# Patient Record
Sex: Female | Born: 1965 | Race: Black or African American | Hispanic: No | Marital: Single | Smoking: Never smoker
Health system: Southern US, Community
[De-identification: ages and names within clinical notes are randomized; demographics above are authoritative.]

## PROBLEM LIST (undated history)

## (undated) DIAGNOSIS — G5600 Carpal tunnel syndrome, unspecified upper limb: Secondary | ICD-10-CM

## (undated) DIAGNOSIS — M1990 Unspecified osteoarthritis, unspecified site: Secondary | ICD-10-CM

---

## 1997-11-30 ENCOUNTER — Inpatient Hospital Stay (HOSPITAL_COMMUNITY): Admission: AD | Admit: 1997-11-30 | Discharge: 1997-11-30 | Payer: Self-pay | Admitting: Obstetrics

## 1998-10-30 ENCOUNTER — Encounter: Payer: Self-pay | Admitting: Emergency Medicine

## 1998-10-30 ENCOUNTER — Emergency Department (HOSPITAL_COMMUNITY): Admission: EM | Admit: 1998-10-30 | Discharge: 1998-10-30 | Payer: Self-pay | Admitting: Emergency Medicine

## 1999-03-26 ENCOUNTER — Inpatient Hospital Stay (HOSPITAL_COMMUNITY): Admission: AD | Admit: 1999-03-26 | Discharge: 1999-03-26 | Payer: Self-pay | Admitting: Obstetrics

## 1999-07-04 ENCOUNTER — Inpatient Hospital Stay (HOSPITAL_COMMUNITY): Admission: AD | Admit: 1999-07-04 | Discharge: 1999-07-04 | Payer: Self-pay | Admitting: Obstetrics

## 2001-04-07 ENCOUNTER — Inpatient Hospital Stay (HOSPITAL_COMMUNITY): Admission: AD | Admit: 2001-04-07 | Discharge: 2001-04-07 | Payer: Self-pay | Admitting: Family Medicine

## 2007-11-24 ENCOUNTER — Emergency Department (HOSPITAL_BASED_OUTPATIENT_CLINIC_OR_DEPARTMENT_OTHER): Admission: EM | Admit: 2007-11-24 | Discharge: 2007-11-24 | Payer: Self-pay | Admitting: Emergency Medicine

## 2010-12-19 LAB — URINALYSIS, ROUTINE W REFLEX MICROSCOPIC
Bilirubin Urine: NEGATIVE
Glucose, UA: NEGATIVE
Hgb urine dipstick: NEGATIVE
Ketones, ur: NEGATIVE
Nitrite: NEGATIVE
Protein, ur: NEGATIVE
Specific Gravity, Urine: 1.005
Urobilinogen, UA: 0.2
pH: 5.5

## 2010-12-19 LAB — BASIC METABOLIC PANEL
BUN: 7
CO2: 27
Calcium: 8.9
Chloride: 105
Creatinine, Ser: 0.8
GFR calc Af Amer: >60
GFR calc non Af Amer: >60
Glucose, Bld: 116 — ABNORMAL HIGH
Potassium: 3.7
Sodium: 140

## 2010-12-19 LAB — CBC
HCT: 37.5
Hemoglobin: 13
MCHC: 34.6
MCV: 93
Platelets: 215
RBC: 4.03
RDW: 12.4
WBC: 2.8 — ABNORMAL LOW

## 2010-12-19 LAB — URINE MICROSCOPIC-ADD ON

## 2010-12-19 LAB — PREGNANCY, URINE: Preg Test, Ur: NEGATIVE

## 2013-06-26 ENCOUNTER — Emergency Department (HOSPITAL_COMMUNITY)
Admission: EM | Admit: 2013-06-26 | Discharge: 2013-06-26 | Disposition: A | Discharge status: Home / Self Care | Payer: Self-pay | Attending: Emergency Medicine | Admitting: Emergency Medicine

## 2013-06-26 ENCOUNTER — Encounter (HOSPITAL_COMMUNITY): Payer: Self-pay | Admitting: Emergency Medicine

## 2013-06-26 ENCOUNTER — Emergency Department (HOSPITAL_COMMUNITY): Payer: Self-pay

## 2013-06-26 DIAGNOSIS — M898X9 Other specified disorders of bone, unspecified site: Secondary | ICD-10-CM | POA: Insufficient documentation

## 2013-06-26 DIAGNOSIS — M25471 Effusion, right ankle: Secondary | ICD-10-CM

## 2013-06-26 DIAGNOSIS — Z880 Allergy status to penicillin: Secondary | ICD-10-CM | POA: Insufficient documentation

## 2013-06-26 DIAGNOSIS — M19079 Primary osteoarthritis, unspecified ankle and foot: Secondary | ICD-10-CM | POA: Insufficient documentation

## 2013-06-26 DIAGNOSIS — M7730 Calcaneal spur, unspecified foot: Secondary | ICD-10-CM

## 2013-06-26 DIAGNOSIS — M25472 Effusion, left ankle: Secondary | ICD-10-CM

## 2013-06-26 MED ORDER — NAPROXEN 500 MG PO TABS
500.0000 mg | ORAL_TABLET | Freq: Once | ORAL | Status: AC
  Administered 2013-06-26: 500 mg via ORAL
  Filled 2013-06-26: qty 1

## 2013-06-26 MED ORDER — NAPROXEN 500 MG PO TABS: 500.0000 mg | ORAL_TABLET | Freq: Two times a day (BID) | ORAL | Status: DC

## 2013-06-26 NOTE — Discharge Instructions (Signed)
Call and make follow up appointment with Orthopedics as listed above. Take Naproxen as directed for pain and swelling. May supplement with OTC tylenol as directed on bottle. Rest, Ice, Elevate both ankles. Recommend good shoes with arch supports. Recommend compression socks.    Heel Spur A heel spur is a hook of bone that can form on the calcaneus (the heel bone and the largest bone of the foot). Heel spurs are often associated with plantar fasciitis and usually come in people who have had the problem for an extended period of time. The cause of the relationship is unknown. The pain associated with them is thought to be caused by an inflammation (soreness and redness) of the plantar fascia rather than the spur itself. The plantar fascia is a thick fibrous like tissue that runs from the calcaneus (heel bone) to the ball of the foot. This strong, tight tissue helps maintain the arch of your foot. It helps distribute the weight across your foot as you walk or run. Stresses placed on the plantar fascia can be tremendous. When it is inflamed normal activities become painful. Pain is worse in the morning after sleeping. After sleeping the plantar fascia is tight. The first movements stretch the fascia and this causes pain. As the tendon loosens, the pain usually gets better. It often returns with too much standing or walking.  About 70% of patients with plantar fasciitis have a heel spur. About half of people without foot pain also have heel spurs. DIAGNOSIS  The diagnosis of a heel spur is made by X-ray. The X-ray shows a hook of bone protruding from the bottom of the calcaneus at the point where the plantar fascia is attached to the heel bone.  TREATMENT  It is necessary to find out what is causing the stretching of the plantar fascia. If the cause is over-pronation (flat feet), orthotics and proper foot ware may help.  Stretching exercises, losing weight, wearing shoes that have a cushioned heel that  absorbs shock, and elevating the heel with the use of a heel cradle, heel cup, or orthotics may all help. Heel cradles and heel cups provide extra comfort and cushion to the heel, and reduce the amount of shock to the sore area. AVOIDING THE PAIN OF PLANTAR FASCIITIS AND HEEL SPURS  Consult a sports medicine professional before beginning a new exercise program.  Walking programs offer a good workout. There is a lower chance of overuse injuries common to the runners. There is less impact and less jarring of the joints.  Begin all new exercise programs slowly. If problems or pains develop, decrease the amount of time or distance until you are at a comfortable level.  Wear good shoes and replace them regularly.  Stretch your foot and the heel cords at the back of the ankle (Achilles tendons) both before and after exercise.  Run or exercise on even surfaces that are not hard. For example, asphalt is better than pavement.  Do not run barefoot on hard surfaces.  If using a treadmill, vary the incline.  Do not continue to workout if you have foot or joint problems. Seek professional help if they do not improve. HOME CARE INSTRUCTIONS   Avoid activities that cause you pain until you recover.  Use ice or cold packs to the problem or painful areas after working out.  Only take over-the-counter or prescription medicines for pain, discomfort, or fever as directed by your caregiver.  Soft shoe inserts or athletic shoes with air or  gel sole cushions may be helpful.  If problems continue or become more severe, consult a sports medicine caregiver. Cortisone is a potent anti-inflammatory medication that may be injected into the painful area. You can discuss this treatment with your caregiver. MAKE SURE YOU:   Understand these instructions.  Will watch your condition.  Will get help right away if you are not doing well or get worse. Document Released: 04/10/2005 Document Revised: 05/27/2011  Document Reviewed: 06/12/2005 [facility] Patient Information 2014 [place], [place].  Edema Edema is a buildup of fluids. It is most common in the feet, ankles, and legs. This happens more as a person ages. It may affect one or both legs. HOME CARE   Raise (elevate) the legs or ankles above the level of the heart while lying down.  Avoid sitting or standing still for a long time.  Exercise the legs to help the puffiness (swelling) go down.  A low-salt diet may help lessen the puffiness.  Only take medicine as told by your doctor. GET HELP RIGHT AWAY IF:   You develop shortness of breath or chest pain.  You cannot breathe when you lie down.  You have more puffiness that does not go away with treatment.  You develop pain or redness in the areas that are puffy.  You have a temperature by mouth above 102 F (38.9 C), not controlled by medicine.  You gain 03 lb/1.4 kg or more in 1 day or 05 lb/2.3 kg in a week. MAKE SURE YOU:   Understand these instructions.  Will watch your condition.  Will get help right away if you are not doing well or get worse. Document Released: 08/21/2007 Document Revised: 05/27/2011 Document Reviewed: 08/21/2007 [facility] Patient Information 2014 [place], [place].

## 2013-06-26 NOTE — ED Notes (Signed)
Pt reports bilateral foot pain x1 week. Sts she is flat footed and all shoes hurt but shes never had pain like this. Sts she has to prop her feet up at night to sleep. Obvious swelling to bilateral ankles. Denies injury, denies Hx of gout.

## 2013-06-26 NOTE — ED Provider Notes (Signed)
CSN: [id number]     Arrival date & time 06/26/13  1603 History   First MD Initiated Contact with Patient 06/26/13 1652     Chief Complaint  Patient presents with  . Foot Pain     (Consider location/radiation/quality/duration/timing/severity/associated sxs/prior Treatment) Patient is a 48 y.o. female presenting with lower extremity pain.  Foot Pain   48 yo female with no reported PMH, presents today with bilateral foot pain and swelling that started 1 week ago. Reports right is worse than left. Patient describes sharp pain intermittent pain rated at 9/10. Pain worse with standing and walking. Pain worse at night. 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 Patient reports having flat feet. Patient denies any calf pain. Denies Cp, SOB, weakness, or fever/chills.   History reviewed. No pertinent past medical history. History reviewed. No pertinent past surgical history. No family history on file. History  Substance Use Topics  . Smoking status: Never Smoker   . Smokeless tobacco: Not on file  . Alcohol Use: No   OB History   Grav Para Term Preterm Abortions TAB SAB Ect Mult Living                 Review of Systems  All other systems reviewed and are negative.     Allergies  Penicillins  Home Medications   Current Outpatient Rx  Name  Route  Sig  Dispense  Refill  . naproxen (NAPROSYN) 500 MG tablet   Oral   Take 1 tablet (500 mg total) by mouth 2 (two) times daily.   30 tablet   0    BP 158/80  Pulse 63  Temp(Src) 98 F (36.7 C) (Oral)  Resp 15  SpO2 99%  LMP 05/26/2013 Physical Exam  Nursing note and vitals reviewed. Constitutional: She is oriented to person, place, and time. She appears well-developed and well-nourished. No distress.  HENT:  Head: Normocephalic and atraumatic.  Eyes: Conjunctivae are normal.  Neck: No JVD present. No tracheal deviation present.  Cardiovascular: Normal rate and regular rhythm.  Exam reveals no gallop and no  friction rub.   No murmur heard. Pulmonary/Chest: Effort normal. No respiratory distress. She has no wheezes. She has no rhonchi. She has no rales.  Musculoskeletal: Normal range of motion. She exhibits no edema.  Bilateral ankle swelling, Right worse than left.  Tenderness to palpation of medial and lateral joint line of RIGHT ankle. Pain with inversion of Right ankle. Normal ROM bilateral ankles without pain.  No overlying eythema or warmth.   Neurological: She is alert and oriented to person, place, and time.  Skin: Skin is warm and dry. She is not diaphoretic.  Psychiatric: She has a normal mood and affect. Her behavior is normal.    ED Course  Procedures (including critical care time) Labs Review Labs Reviewed - No data to display Imaging Review Dg Ankle Complete Left  06/26/2013   CLINICAL DATA:  Bilateral ankle swelling for 1 week, no history of trauma  EXAM: LEFT ANKLE COMPLETE - 3+ VIEW  COMPARISON:  None.  FINDINGS: Diffuse soft tissue swelling. No fracture or dislocation. Moderate heel spur. Mild tibiotalar arthritis.  IMPRESSION: Nonspecific soft tissue swelling   Electronically Signed   By: Esperanza Heir M.D.   On: 06/26/2013 17:55   Dg Ankle Complete Right  06/26/2013   CLINICAL DATA:  Bilateral ankle swelling for 1 week with no trauma  EXAM: RIGHT ANKLE - COMPLETE 3+ VIEW  COMPARISON:  None.  FINDINGS: Diffuse soft tissue swelling. No fracture or dislocation. Moderate heel spur. Mild tibiotalar arthritis.  IMPRESSION: Nonspecific soft tissue swelling   Electronically Signed   By: Esperanza Heiraymond  Rubner M.D.   On: 06/26/2013 17:55     EKG Interpretation None      MDM   Final diagnoses:  Swelling of both ankles  Heel spur  Arthritis of ankle joint   Patietn afebrile. Presents with bilateral ankle pain/swelling. Doubt gout, septic joint, or hemarthrosis.  Plain films show bilateral heel spurs with tibiotalar arthritis.  Plan to have patient follow up with Ortho in 1 week  for further evaluation and management.  Will treat patient symptoms. Recommend supplement with OTC tylenol as directed on bottle. Rest, Ice, Elevate both ankles. Recommend good shoes with arch supports. Recommend compression socks. Recommend establishing PCP for recheck of BP as it was slightly elevated today. Patient confirms understanding. Discharged in good condition.   Meds given in ED:  Medications  naproxen (NAPROSYN) tablet 500 mg (500 mg Oral Given 06/26/13 1756)    New Prescriptions   NAPROXEN (NAPROSYN) 500 MG TABLET    Take 1 tablet (500 mg total) by mouth 2 (two) times daily.       Rudene AndaJacob Gray Anmol Fleck, PA-C 06/27/13 1331

## 2013-06-27 NOTE — ED Provider Notes (Signed)
Medical screening examination/treatment/procedure(s) were conducted as a shared visit with non-physician practitioner(s) or resident  and myself.  I personally evaluated the patient during the encounter and agree with the findings and plan unless otherwise indicated.    I have personally reviewed any xrays and/ or EKG's with the provider and I agree with interpretation.   New ankle swelling bilateral without injury, fevers, rash or other joint involvement, no hx of gout or RA.  Xray done.  Pt has known flat feet.  Discussed supportive care and ortho fup. No concern for septic joint. Dg Ankle Complete Left  06/26/2013   CLINICAL DATA:  Bilateral ankle swelling for 1 week, no history of trauma  EXAM: LEFT ANKLE COMPLETE - 3+ VIEW  COMPARISON:  None.  FINDINGS: Diffuse soft tissue swelling. No fracture or dislocation. Moderate heel spur. Mild tibiotalar arthritis.  IMPRESSION: Nonspecific soft tissue swelling   Electronically Signed   By: Esperanza Heiraymond  Rubner M.D.   On: 06/26/2013 17:55   Dg Ankle Complete Right  06/26/2013   CLINICAL DATA:  Bilateral ankle swelling for 1 week with no trauma  EXAM: RIGHT ANKLE - COMPLETE 3+ VIEW  COMPARISON:  None.  FINDINGS: Diffuse soft tissue swelling. No fracture or dislocation. Moderate heel spur. Mild tibiotalar arthritis.  IMPRESSION: Nonspecific soft tissue swelling   Electronically Signed   By: Esperanza Heiraymond  Rubner M.D.   On: 06/26/2013 17:55   Ankle swelling bilateral, ankle arthritis  Enid SkeensJoshua M Micheala Morissette, MD 06/27/13 1727

## 2014-12-28 ENCOUNTER — Emergency Department (HOSPITAL_COMMUNITY)
Admission: EM | Admit: 2014-12-28 | Discharge: 2014-12-28 | Disposition: A | Discharge status: Home / Self Care | Payer: Self-pay | Attending: Emergency Medicine | Admitting: Emergency Medicine

## 2014-12-28 ENCOUNTER — Encounter (HOSPITAL_COMMUNITY): Payer: Self-pay | Admitting: Emergency Medicine

## 2014-12-28 ENCOUNTER — Emergency Department (HOSPITAL_COMMUNITY): Payer: Self-pay

## 2014-12-28 DIAGNOSIS — R51 Headache: Secondary | ICD-10-CM | POA: Insufficient documentation

## 2014-12-28 DIAGNOSIS — M1990 Unspecified osteoarthritis, unspecified site: Secondary | ICD-10-CM | POA: Insufficient documentation

## 2014-12-28 DIAGNOSIS — Z880 Allergy status to penicillin: Secondary | ICD-10-CM | POA: Insufficient documentation

## 2014-12-28 DIAGNOSIS — M25562 Pain in left knee: Secondary | ICD-10-CM

## 2014-12-28 DIAGNOSIS — M79671 Pain in right foot: Secondary | ICD-10-CM

## 2014-12-28 DIAGNOSIS — M159 Polyosteoarthritis, unspecified: Secondary | ICD-10-CM

## 2014-12-28 MED ORDER — IBUPROFEN 800 MG PO TABS: 800.0000 mg | ORAL_TABLET | Freq: Three times a day (TID) | ORAL | Status: DC

## 2014-12-28 MED ORDER — TRAMADOL HCL 50 MG PO TABS: 50.0000 mg | ORAL_TABLET | Freq: Four times a day (QID) | ORAL | Status: DC | PRN

## 2014-12-28 MED ORDER — TRAMADOL HCL 50 MG PO TABS
50.0000 mg | ORAL_TABLET | Freq: Once | ORAL | Status: AC
  Administered 2014-12-28: 50 mg via ORAL
  Filled 2014-12-28: qty 1

## 2014-12-28 NOTE — ED Provider Notes (Signed)
CSN: [id number]     Arrival date & time 12/28/14  1510 History  By signing my name below, I, [name], attest that this documentation has been prepared under the direction and in the presence of Danelle Berry, PA-C. Electronically Signed: [name], ED Scribe. 12/28/2014. 5:15 PM.   Chief Complaint  Patient presents with  . Generalized Body Aches   The history is provided by the patient. No language interpreter was used.    HPI Comments: Karen Cuevas is a 49 y.o. female who presents to the Emergency Department complaining of multiple points of moderate pain and swelling with onset in the past few days. 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 Pt notes associated pain and swelling to her bilateral feet, bilateral ankles and left knee. She also notes an associated, mild, intermittent, HA . Pt notes taking OTC aleve and ibuprofen which has provided no relief. Pt notes a hx of seasonal allergies but denies any other known medical conditions. She denies any hx of injury to the affected regions but confirms being told by a prior provider that she has arthritis. She denies any n/v/d, abd pain and redness or irritation.   History reviewed. No pertinent past medical history. No past surgical history on file. No family history on file. Social History  Substance Use Topics  . Smoking status: Never Smoker   . Smokeless tobacco: None  . Alcohol Use: No   OB History    No data available     Review of Systems  Gastrointestinal: Negative for nausea, vomiting, abdominal pain and diarrhea.  Musculoskeletal: Positive for myalgias, joint swelling and arthralgias.  Skin: Negative for color change and rash.  Neurological: Positive for headaches.   Allergies  Penicillins  Home Medications   Prior to Admission medications   Medication Sig Start Date End Date Taking? Authorizing Provider  Homeopathic Products (BHI BACK PAIN RELIEF) TABS Take 1-2  tablets by mouth every 6 (six) hours as needed (pain).   Yes Historical Provider, MD  naproxen sodium (ANAPROX) 220 MG tablet Take 220-660 mg by mouth every 6 (six) hours as needed (pain).   Yes Historical Provider, MD  ibuprofen (ADVIL,MOTRIN) 800 MG tablet Take 1 tablet (800 mg total) by mouth 3 (three) times daily. 12/28/14   Danelle Berry, PA-C  traMADol (ULTRAM) 50 MG tablet Take 1 tablet (50 mg total) by mouth every 6 (six) hours as needed. 12/28/14   Danelle Berry, PA-C   BP 118/68 mmHg  Pulse 65  Temp(Src) 98 F (36.7 C) (Oral)  Resp 18  SpO2 97%  LMP 11/23/2014 Physical Exam  Constitutional: She is oriented to person, place, and time. She appears well-developed and well-nourished. No distress.  HENT:  Head: Normocephalic and atraumatic.  Right Ear: External ear normal.  Left Ear: External ear normal.  Nose: Nose normal.  Mouth/Throat: Oropharynx is clear and moist. No oropharyngeal exudate.  Eyes: Conjunctivae and EOM are normal. Pupils are equal, round, and reactive to light. Right eye exhibits no discharge. Left eye exhibits no discharge. No scleral icterus.  Neck: Normal range of motion. Neck supple. No JVD present. No tracheal deviation present.  Cardiovascular: Normal rate and regular rhythm.   Pulmonary/Chest: Effort normal and breath sounds normal. No stridor. No respiratory distress.  Musculoskeletal: Normal range of motion. She exhibits edema and tenderness.       Left knee: She exhibits normal range of motion, no swelling, no deformity, no erythema, normal alignment and  no bony tenderness. No medial joint line, no lateral joint line, no MCL, no LCL and no patellar tendon tenderness noted.       Right ankle: She exhibits swelling and deformity. She exhibits normal range of motion, no ecchymosis, no laceration and normal pulse. Tenderness. Lateral malleolus, medial malleolus, AITFL, CF ligament and posterior TFL tenderness found. No head of 5th metatarsal and no proximal fibula  tenderness found. Achilles tendon exhibits no pain, no defect and normal Thompson's test results.       Legs:      Feet:  Lymphadenopathy:    She has no cervical adenopathy.  Neurological: She is alert and oriented to person, place, and time. She exhibits normal muscle tone. Coordination normal.  Skin: Skin is warm and dry. No rash noted. She is not diaphoretic. No erythema. No pallor.  Psychiatric: She has a normal mood and affect. Her behavior is normal. Judgment and thought content normal.   ED Course  Procedures  DIAGNOSTIC STUDIES: Oxygen Saturation is 99% on RA, normal by my interpretation.    COORDINATION OF CARE: 5:08 PM Discussed treatment plan with pt at bedside including 1x 50 mg tramadol and imaging of the right ankle and foot. Pt agreed to plan.  Labs Review Labs Reviewed - No data to display  Imaging Review DG Foot Complete Right (Final result) Result time: 12/28/14 17:35:09   Final result by Rad Results In Interface (12/28/14 17:35:09)   Narrative:   CLINICAL DATA: Chronic right foot pain, worsening. History of arthritis.  EXAM: RIGHT FOOT COMPLETE - 3+ VIEW  COMPARISON: None.  FINDINGS: Degenerative changes in the midfoot and hindfoot. Mild osteoarthritis in the first MTP joint. No acute bony abnormality. Specifically, no fracture, subluxation, or dislocation. Soft tissues are intact. Plantar calcaneal spur.  IMPRESSION: No acute bony abnormality.   Electronically Signed By: Charlett Nose M.D. On: 12/28/2014 17:35          DG Ankle Complete Right (Final result) Result time: 12/28/14 17:42:03   Final result by Rad Results In Interface (12/28/14 17:42:03)   Narrative:   CLINICAL DATA: Worsening right ankle pain and swelling. Arthritis. No known injury.  EXAM: RIGHT ANKLE - COMPLETE 3+ VIEW  COMPARISON: None.  FINDINGS: There is no evidence of fracture, dislocation, or joint effusion. Mild to moderate degenerative spurring is  seen involving the ankle joint. Diffuse soft tissue swelling also noted.  IMPRESSION: No evidence of fracture or dislocation.  Ankle DJD and soft tissue swelling.   Electronically Signed By: Myles Rosenthal M.D. On: 12/28/2014 17:42     No results found. I have personally reviewed and evaluated these images as part of my medical decision-making.   EKG Interpretation None      MDM   Final diagnoses:  Right foot pain  Osteoarthritis of multiple joints, unspecified osteoarthritis type  Left knee pain    Pt with multiple joint pain and muscle aches after strenous physical labor at her job Xrays show progressive DJD, no acute osseous injury Pt given tramadol in ED, prescription ibuprofen and tramadol rx given.  Pt wants to be in "no pain," however I have explained to her that she has chronic joint disease and OA, and will likely have chronic joint pain, worse with overuse.  Case mgmt to help with establishing care (possible Orange card help?) for pt's future need for ortho referral access.  D/C home with tramadol and NSAID  I personally performed the services described in this documentation, which was scribed in my  presence. The recorded information has been reviewed and is accurate.    Danelle BerryLeisa Paton Crum, PA-C 01/06/15 [id number]  Arby BarretteMarcy Pfeiffer, MD 01/16/15 [phone]

## 2014-12-28 NOTE — Progress Notes (Signed)
EDCM spoke to patient at bedside. Patient confirms she [place] a [place]cp [place]  [facility], informed patient of services there.  EDCM also provided patient with list of pcps who accept self pay patients, list of discount pharmacies and websites needymeds.org and GoodRX.com for medication assistance, phone number to inquire about the orange card, phone number to inquire about Mediciad, phone number to inquire about the Affordable Care Act, financial resources in the community such as local churches, [employer], urban ministries, and dental assistance for uninsured patients.  Patient thankful for resources.  No further EDCM needs at this time.  [facility].  [facility] referral completed.

## 2014-12-28 NOTE — ED Notes (Signed)
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  C/o rt foot pain, lt knee pain, and gen body aches.  Denies NVD.  Denies abd pain.

## 2014-12-28 NOTE — Discharge Instructions (Signed)
Joint Pain Joint pain can be caused by many things. The joint can be bruised, infected, weak from aging, or sore from exercise. The pain will probably go away if you follow your doctor's instructions for home care. If your joint pain continues, more tests may be needed to help find the cause of your condition. HOME CARE Watch your condition for any changes. Follow these instructions as told to lessen the pain that you are feeling:  Take medicines only as told by your doctor.  Rest the sore joint for as long as told by your doctor. If your doctor tells you to, raise (elevate) the painful joint above the level of your heart while you are sitting or lying down.  Do not do things that cause pain or make the pain worse.  If told, put ice on the painful area:  Put ice in a plastic bag.  Place a towel between your skin and the bag.  Leave the ice on for 20 minutes, 2-3 times per day.  Wear an elastic bandage, splint, or sling as told by your doctor. Loosen the bandage or splint if your fingers or toes lose feeling (become numb) and tingle, or if they turn cold and blue.  Begin exercising or stretching the joint as told by your doctor. Ask your doctor what types of exercise are safe for you.  Keep all follow-up visits as told by your doctor. This is important. GET HELP IF:  Your pain gets worse and medicine does not help it.  Your joint pain does not get better in 3 days.  You have more bruising or swelling.  You have a fever.  You lose 10 pounds (4.5 kg) or more without trying. GET HELP RIGHT AWAY IF:  You are not able to move the joint.  Your fingers or toes become numb or they turn cold and blue.   This information is not intended to replace advice given to you by your health care provider. Make sure you discuss any questions you have with your health care provider.   Document Released: 02/20/2009 Document Revised: 03/25/2014 Document Reviewed: 12/14/2013 Elsevier Interactive  Patient Education 2016 Elsevier Inc.  Osteoarthritis Osteoarthritis is a disease that causes soreness and inflammation of a joint. It occurs when the cartilage at the affected joint wears down. Cartilage acts as a cushion, covering the ends of bones where they meet to form a joint. Osteoarthritis is the most common form of arthritis. It often occurs in older people. The joints affected most often by this condition include those in the:  Ends of the fingers.  Thumbs.  Neck.  Lower back.  Knees.  Hips. CAUSES  Over time, the cartilage that covers the ends of bones begins to wear away. This causes bone to rub on bone, producing pain and stiffness in the affected joints.  RISK FACTORS Certain factors can increase your chances of having osteoarthritis, including:  Older age.  Excessive body weight.  Overuse of joints.  Previous joint injury. SIGNS AND SYMPTOMS   Pain, swelling, and stiffness in the joint.  Over time, the joint may lose its normal shape.  Small deposits of bone (osteophytes) may grow on the edges of the joint.  Bits of bone or cartilage can break off and float inside the joint space. This may cause more pain and damage. DIAGNOSIS  Your health care provider will do a physical exam and ask about your symptoms. Various tests may be ordered, such as:  X-rays of the affected  joint.  Blood tests to rule out other types of arthritis. Additional tests may be used to diagnose your condition. TREATMENT  Goals of treatment are to control pain and improve joint function. Treatment plans may include:  A prescribed exercise program that allows for rest and joint relief.  A weight control plan.  Pain relief techniques, such as:  Properly applied heat and cold.  Electric pulses delivered to nerve endings under the skin (transcutaneous electrical nerve stimulation [TENS]).  Massage.  Certain nutritional supplements.  Medicines to control pain, such  as:  Acetaminophen.  Nonsteroidal anti-inflammatory drugs (NSAIDs), such as naproxen.  Narcotic or central-acting agents, such as tramadol.  Corticosteroids. These can be given orally or as an injection.  Surgery to reposition the bones and relieve pain (osteotomy) or to remove loose pieces of bone and cartilage. Joint replacement may be needed in advanced states of osteoarthritis. HOME CARE INSTRUCTIONS   Take medicines only as directed by your health care provider.  Maintain a healthy weight. Follow your health care provider's instructions for weight control. This may include dietary instructions.  Exercise as directed. Your health care provider can recommend specific types of exercise. These may include:  Strengthening exercises. These are done to strengthen the muscles that support joints affected by arthritis. They can be performed with weights or with exercise bands to add resistance.  Aerobic activities. These are exercises, such as brisk walking or low-impact aerobics, that get your heart pumping.  Range-of-motion activities. These keep your joints limber.  Balance and agility exercises. These help you maintain daily living skills.  Rest your affected joints as directed by your health care provider.  Keep all follow-up visits as directed by your health care provider. SEEK MEDICAL CARE IF:   Your skin turns red.  You develop a rash in addition to your joint pain.  You have worsening joint pain.  You have a fever along with joint or muscle aches. SEEK IMMEDIATE MEDICAL CARE IF:  You have a significant loss of weight or appetite.  You have night sweats. FOR MORE INFORMATION   National Institute of Arthritis and Musculoskeletal and Skin Diseases: www.niams.http://www.myers.net/nih.gov  [employer] Aging: https://walker.com/www.nia.nih.gov  American College of Rheumatology: www.rheumatology.org   This information is not intended to replace advice given to you by your health care provider.  Make sure you discuss any questions you have with your health care provider.   Document Released: 03/04/2005 Document Revised: 03/25/2014 Document Reviewed: 11/09/2012 Elsevier Interactive Patient Education [employer] Elsevier Inc.

## 2015-12-19 ENCOUNTER — Telehealth (HOSPITAL_COMMUNITY): Payer: Self-pay

## 2016-01-10 ENCOUNTER — Ambulatory Visit (HOSPITAL_COMMUNITY): Payer: Self-pay | Admitting: Clinical

## 2016-05-29 ENCOUNTER — Emergency Department (HOSPITAL_BASED_OUTPATIENT_CLINIC_OR_DEPARTMENT_OTHER): Payer: No Typology Code available for payment source

## 2016-05-29 ENCOUNTER — Emergency Department (HOSPITAL_BASED_OUTPATIENT_CLINIC_OR_DEPARTMENT_OTHER)
Admission: EM | Admit: 2016-05-29 | Discharge: 2016-05-29 | Disposition: A | Discharge status: Home / Self Care | Payer: No Typology Code available for payment source | Attending: Emergency Medicine | Admitting: Emergency Medicine

## 2016-05-29 ENCOUNTER — Encounter (HOSPITAL_BASED_OUTPATIENT_CLINIC_OR_DEPARTMENT_OTHER): Payer: Self-pay

## 2016-05-29 DIAGNOSIS — S99922A Unspecified injury of left foot, initial encounter: Secondary | ICD-10-CM | POA: Diagnosis present

## 2016-05-29 DIAGNOSIS — Y999 Unspecified external cause status: Secondary | ICD-10-CM | POA: Insufficient documentation

## 2016-05-29 DIAGNOSIS — S8392XA Sprain of unspecified site of left knee, initial encounter: Secondary | ICD-10-CM

## 2016-05-29 DIAGNOSIS — Y939 Activity, unspecified: Secondary | ICD-10-CM | POA: Insufficient documentation

## 2016-05-29 DIAGNOSIS — S93602A Unspecified sprain of left foot, initial encounter: Secondary | ICD-10-CM | POA: Diagnosis not present

## 2016-05-29 DIAGNOSIS — Y92410 Unspecified street and highway as the place of occurrence of the external cause: Secondary | ICD-10-CM | POA: Diagnosis not present

## 2016-05-29 HISTORY — DX: Unspecified osteoarthritis, unspecified site: M19.90
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
## 2019-05-26 ENCOUNTER — Encounter: Payer: Self-pay | Admitting: Neurology

## 2019-06-02 ENCOUNTER — Telehealth: Payer: Self-pay

## 2019-06-04 NOTE — Telephone Encounter (Signed)
ERROR

## 2019-06-25 ENCOUNTER — Other Ambulatory Visit: Payer: Self-pay

## 2019-06-25 ENCOUNTER — Encounter (HOSPITAL_BASED_OUTPATIENT_CLINIC_OR_DEPARTMENT_OTHER): Payer: Self-pay

## 2019-06-25 ENCOUNTER — Emergency Department (HOSPITAL_BASED_OUTPATIENT_CLINIC_OR_DEPARTMENT_OTHER): Payer: 59

## 2019-06-25 ENCOUNTER — Emergency Department (HOSPITAL_BASED_OUTPATIENT_CLINIC_OR_DEPARTMENT_OTHER)
Admission: EM | Admit: 2019-06-25 | Discharge: 2019-06-25 | Disposition: A | Discharge status: Home / Self Care | Payer: 59 | Attending: Emergency Medicine | Admitting: Emergency Medicine

## 2019-06-25 DIAGNOSIS — M1990 Unspecified osteoarthritis, unspecified site: Secondary | ICD-10-CM | POA: Insufficient documentation

## 2019-06-25 DIAGNOSIS — M25561 Pain in right knee: Secondary | ICD-10-CM | POA: Diagnosis present

## 2019-06-25 HISTORY — DX: Carpal tunnel syndrome, unspecified upper limb: G56.00

## 2019-06-25 MED ORDER — DICLOFENAC SODIUM 1 % EX GEL: 2.0000 g | Freq: Four times a day (QID) | CUTANEOUS | 0 refills | Status: AC | PRN

## 2019-06-25 MED ORDER — IBUPROFEN 800 MG PO TABS: 800.0000 mg | ORAL_TABLET | Freq: Three times a day (TID) | ORAL | 0 refills | Status: AC | PRN

## 2019-06-25 NOTE — Discharge Instructions (Signed)
Your workup was reassuring today that you do not have a bony injury or dislocation to your knee.  We recommend that you use the provided Ace wrap and crutches as needed, but you can continue to bear weight as tolerated.  Read through the included information about routine care for injuries.  Follow up as recommended if you are still having problems in about a week. ° °Knee Pain °Knee pain is a very common symptom and can have many causes. Knee pain often goes away when you follow your health care provider's instructions for relieving pain and discomfort at home. However, knee pain can develop into a condition that needs treatment. Some conditions may include: °Arthritis caused by wear and tear (osteoarthritis). °Arthritis caused by swelling and irritation (rheumatoid arthritis or gout). °A cyst or growth in your knee. °An infection in your knee joint. °An injury that will not heal. °Damage, swelling, or irritation of the tissues that support your knee (torn ligaments or tendinitis). °If your knee pain continues, additional tests may be ordered to diagnose your condition. Tests may include X-rays or other imaging studies of your knee. You may also need to have fluid removed from your knee. Treatment for ongoing knee pain depends on the cause, but treatment may include: °Medicines to relieve pain or swelling. °Steroid injections in your knee. °Physical therapy. °Surgery. °HOME CARE INSTRUCTIONS °Take medicines only as directed by your health care provider. °Rest your knee and keep it raised (elevated) while you are resting. °Do not do things that cause or worsen pain. °Avoid high-impact activities or exercises, such as running, jumping rope, or doing jumping jacks. °Apply ice to the knee area: °Put ice in a plastic bag. °Place a towel between your skin and the bag. °Leave the ice on for 20 minutes, 2-3 times a day. °Ask your health care provider if you should wear an elastic knee support. °Keep a pillow under your  knee when you sleep. °Lose weight if you are overweight. Extra weight can put pressure on your knee. °Do not use any tobacco products, including cigarettes, chewing tobacco, or electronic cigarettes. If you need help quitting, ask your health care provider. Smoking may slow the healing of any bone and joint problems that you may have. °SEEK MEDICAL CARE IF: °Your knee pain continues, changes, or gets worse. °You have a fever along with knee pain. °Your knee buckles or locks up. °Your knee becomes more swollen. °SEEK IMMEDIATE MEDICAL CARE IF:  °Your knee joint feels hot to the touch. °You have chest pain or trouble breathing. °  °This information is not intended to replace advice given to you by your health care provider. Make sure you discuss any questions you have with your health care provider. °  °Document Released: 12/30/2006 Document Revised: 03/25/2014 Document Reviewed: 10/18/2013 °Elsevier Interactive Patient Education ©2016 Elsevier Inc. ° ° °Elastic Bandage and RICE  ° °WHAT DOES AN ELASTIC BANDAGE DO?  ° °Elastic bandages come in different shapes and sizes. They generally provide support to your injury and reduce swelling while you are healing, but they can perform different functions. Your health care provider will help you to decide what is best for your protection, recovery, or rehabilitation following an injury.  °WHAT ARE SOME GENERAL TIPS FOR USING AN ELASTIC BANDAGE?  °Use the bandage as directed by the maker of the bandage that you are using.  °Do not wrap the bandage too tightly. This may cut off the circulation in the arm or leg in the   area below the bandage.  °If part of your body beyond the bandage becomes blue, numb, cold, swollen, or is more painful, your bandage is most likely too tight. If this occurs, remove your bandage and reapply it more loosely. °See your health care provider if the bandage seems to be making your problems worse rather than better.  °An elastic bandage should be  removed and reapplied every 3-4 hours or as directed by your health care provider. °WHAT IS RICE?  °The routine care of many injuries includes rest, ice, compression, and elevation (RICE therapy).  °Rest  °Rest is required to allow your body to heal. Generally, you can resume your routine activities when you are comfortable and have been given permission by your health care provider.  °Ice  °Icing your injury helps to keep the swelling down and it reduces pain. Do not apply ice directly to your skin.  °Put ice in a plastic bag.  °Place a towel between your skin and the bag.  °Leave the ice on for 20 minutes, 2-3 times per day. °Do this for as Demetres Prochnow as you are directed by your health care provider.  °Compression  °Compression helps to keep swelling down, gives support, and helps with discomfort. Compression may be done with an elastic bandage.  °Elevation  °Elevation helps to reduce swelling and it decreases pain. If possible, your injured area should be placed at or above the level of your heart or the center of your chest.  °WHEN SHOULD I SEEK MEDICAL CARE?  °You should seek medical care if:  °You have persistent pain and swelling.  °Your symptoms are getting worse rather than improving. °These symptoms may indicate that further evaluation or further X-rays are needed. Sometimes, X-rays may not show a small broken bone (fracture) until a number of days later. Make a follow-up appointment with your health care provider. Ask when your X-ray results will be ready. Make sure that you get your X-ray results.  °WHEN SHOULD I SEEK IMMEDIATE MEDICAL CARE?  °You should seek immediate medical care if:  °You have a sudden onset of severe pain at or below the area of your injury.  °You develop redness or increased swelling around your injury.  °You have tingling or numbness at or below the area of your injury that does not improve after you remove the elastic bandage. °This information is not intended to replace advice given to  you by your health care provider. Make sure you discuss any questions you have with your health care provider.  °Document Released: 08/24/2001 Document Revised: 11/23/2014 Document Reviewed: 10/18/2013  °Elsevier Interactive Patient Education ©2016 Elsevier Inc.  ° °

## 2019-06-25 NOTE — ED Triage Notes (Signed)
Pt c/o pain/swelling to right knee x 3 days-denies injury-limping gait

## 2019-06-25 NOTE — ED Provider Notes (Signed)
Emergency Department Provider Note   I have reviewed the triage vital signs and the nursing notes.   HISTORY  Chief Complaint Knee Pain   HPI Karen Cuevas is a 54 y.o. female with PMH of arthritis and carpal tunnel presents to the emergency department for evaluation of right knee pain worsening over the past 3 to 4 days.  Patient denies any injury.  She stands at work for her job and has noticed increasing pain and some swelling in the right knee compared to the left.  She denies pain in the back of the knee or calf.  No fevers, knee redness, warmth.  She has tried Tylenol with no relief.  Symptoms seem to be gradually worsening.  No prior history of similar pain in the knee.  Pain is moderate, worse with standing, no radiation of symptoms.   Past Medical History:  Diagnosis Date  . Arthritis   . Carpal tunnel syndrome     There are no problems to display for this patient.   History reviewed. No pertinent surgical history.  Allergies Penicillins  No family history on file.  Social History Social History   Tobacco Use  . Smoking status: Never Smoker  . Smokeless tobacco: Never Used  Substance Use Topics  . Alcohol use: No  . Drug use: No    Review of Systems  Constitutional: No fever/chills Gastrointestinal: No abdominal pain. Musculoskeletal: Negative for back pain. Positive right knee pain.  Skin: Negative for rash.  ____________________________________________   PHYSICAL EXAM:  VITAL SIGNS: ED Triage Vitals  Enc Vitals Group     BP 06/25/19 1710 (!) 156/89     Pulse Rate 06/25/19 1710 80     Resp 06/25/19 1710 18     Temp 06/25/19 1710 98.2 F (36.8 C)     Temp Source 06/25/19 1710 Oral     SpO2 06/25/19 1710 100 %     Weight 06/25/19 1710 296 lb (134.3 kg)     Height 06/25/19 1710 5\' 2"  (1.575 m)   Constitutional: Alert and oriented. Well appearing and in no acute distress. Eyes: Conjunctivae are normal.  Head: Atraumatic. Neck: No  stridor.  Cardiovascular: Good peripheral circulation. Respiratory: Normal respiratory effort.  Gastrointestinal: No distention.  Musculoskeletal: No gross deformities of extremities.  Mild swelling of the right knee without erythema or warmth.  Normal range of motion of the right knee. No calf tenderness or swelling noted.  Neurologic:  Normal speech and language.  Skin:  Skin is warm, dry and intact. No rash noted. No erythema over the right knee. No warmth.   ____________________________________________  RADIOLOGY  DG Knee Complete 4 Views Right  Result Date: 06/25/2019 CLINICAL DATA:  Right knee pain/swelling EXAM: RIGHT KNEE - COMPLETE 4+ VIEW COMPARISON:  None. FINDINGS: No fracture or dislocation is seen. Moderate tricompartmental degenerative changes, most prominent at the patellofemoral compartment. Small suprapatellar knee joint effusion. IMPRESSION: Moderate degenerative changes with small suprapatellar knee joint effusion. Electronically Signed   By: Julian Hy M.D.   On: 06/25/2019 17:29    ____________________________________________   PROCEDURES  Procedure(s) performed:   Procedures  None  ____________________________________________   INITIAL IMPRESSION / ASSESSMENT AND PLAN / ED COURSE  Pertinent labs & imaging results that were available during my care of the patient were reviewed by me and considered in my medical decision making (see chart for details).   Patient presents to the emergency department for evaluation of right knee pain.  No clinical evidence  to suspect septic joint.  X-ray reviewed showing small knee effusion with moderate degenerative changes.  Plan for Ace wrap and RICE treatment at home with sports med f/u. Provided Motrin and Voltaren Rx. Discussed ED return precautions. Exam not consistent with DVT.    ____________________________________________  FINAL CLINICAL IMPRESSION(S) / ED DIAGNOSES  Final diagnoses:  Acute pain of right  knee    NEW OUTPATIENT MEDICATIONS STARTED DURING THIS VISIT:  New Prescriptions   DICLOFENAC SODIUM (VOLTAREN) 1 % GEL    Apply 2 g topically 4 (four) times daily as needed.   IBUPROFEN (ADVIL) 800 MG TABLET    Take 1 tablet (800 mg total) by mouth every 8 (eight) hours as needed.    Note:  This document was prepared using Dragon voice recognition software and may include unintentional dictation errors.  Alona Bene, MD, [facility] Emergency Medicine    Frederick Marro, Arlyss Repress, MD 06/25/19 [phone]

## 2019-07-05 ENCOUNTER — Other Ambulatory Visit: Payer: Self-pay

## 2019-07-05 ENCOUNTER — Encounter: Payer: Self-pay | Admitting: Orthopedic Surgery

## 2019-07-05 ENCOUNTER — Ambulatory Visit (INDEPENDENT_AMBULATORY_CARE_PROVIDER_SITE_OTHER): Payer: 59 | Admitting: Orthopedic Surgery

## 2019-07-05 VITALS — Ht 62.0 in | Wt 296.0 lb

## 2019-07-05 DIAGNOSIS — M1711 Unilateral primary osteoarthritis, right knee: Secondary | ICD-10-CM

## 2019-07-05 MED ORDER — METHYLPREDNISOLONE ACETATE 40 MG/ML IJ SUSP
40.0000 mg | INTRAMUSCULAR | Status: AC | PRN
  Administered 2019-07-05: 40 mg via INTRA_ARTICULAR

## 2019-07-05 MED ORDER — LIDOCAINE HCL (PF) 1 % IJ SOLN
5.0000 mL | INTRAMUSCULAR | Status: AC | PRN
  Administered 2019-07-05: 5 mL

## 2019-07-05 NOTE — Progress Notes (Signed)
Office Visit Note   Patient: Karen Cuevas           Date of Birth: 02/07/1966           MRN: [id number] Visit Date: 07/05/2019              Requested by: No referring provider defined for this encounter. PCP: Patient, No Pcp Per  Chief Complaint  Patient presents with  . Right Knee - Pain      HPI: Patient is a 54 year old woman who presents with arthritic pain of her right knee for a week she denies any injuries she complains of stiffness states the knee feels tight has swelling and has had giving way.  She complains of pain with ambulation with popping and clicking.  She states she does have start up stiffness.  She [facility] [place] [facility] radiographs showed no bony abnormalities.  She has been using ibuprofen for pain.  Assessment & Plan: Visit Diagnoses:  1. Unilateral primary osteoarthritis, right knee     Plan: The knee was injected she tolerated this well recommended strength and exercise.  Will reevaluate in 4 weeks.  Follow-Up Instructions: Return in about 4 weeks (around 08/02/2019).   Ortho Exam  Patient is alert, oriented, no adenopathy, well-dressed, normal affect, normal respiratory effort. Examination patient has an antalgic gait she has a mild effusion she has crepitation in the patellofemoral joint with range of motion medial and lateral joint lines are minimally tender to palpation collaterals and cruciates are stable.  Imaging: No results found. No images are attached to the encounter.  Labs: No results found for: HGBA1C, ESRSEDRATE, CRP, LABURIC, REPTSTATUS, GRAMSTAIN, CULT, LABORGA   No results found for: ALBUMIN, PREALBUMIN, LABURIC  No results found for: MG No results found for: VD25OH  No results found for: PREALBUMIN CBC EXTENDED 11/24/2007  WBC 2.8(L)  RBC 4.03  HGB 13.0  HCT 37.5  PLT 215     Body mass index is 54.14 kg/m.  Orders:  No orders of the defined types were placed in this  encounter.  No orders of the defined types were placed in this encounter.    Procedures: Large Joint Inj: R knee on 07/05/2019 1:55 PM Indications: pain and diagnostic evaluation Details: 22 G 1.5 in needle, anteromedial approach  Arthrogram: No  Medications: 5 mL lidocaine (PF) 1 %; 40 mg methylPREDNISolone acetate 40 MG/ML Outcome: tolerated well, no immediate complications Procedure, treatment alternatives, risks and benefits explained, specific risks discussed. Consent was given by the patient. Immediately prior to procedure a time out was called to verify the correct patient, procedure, equipment, support staff and site/side marked as required. Patient was prepped and draped in the usual sterile fashion.      Clinical Data: No additional findings.  ROS:  All other systems negative, except as noted in the HPI. Review of Systems  Objective: Vital Signs: Ht 5\' 2"  (1.575 m)   Wt 296 lb (134.3 kg)   LMP 11/23/2014   BMI 54.14 kg/m   Specialty Comments:  No specialty comments available.  PMFS History: There are no problems to display for this patient.  Past Medical History:  Diagnosis Date  . Arthritis   . Carpal tunnel syndrome     History reviewed. No pertinent family history.  History reviewed. No pertinent surgical history. Social History   Occupational History  . Not on file  Tobacco Use  . Smoking status: Never Smoker  . Smokeless tobacco:  Never Used  Substance and Sexual Activity  . Alcohol use: No  . Drug use: No  . Sexual activity: Not on file

## 2019-07-08 ENCOUNTER — Other Ambulatory Visit: Payer: Self-pay

## 2019-07-08 ENCOUNTER — Encounter: Payer: Self-pay | Admitting: Neurology

## 2019-07-08 ENCOUNTER — Ambulatory Visit (INDEPENDENT_AMBULATORY_CARE_PROVIDER_SITE_OTHER): Payer: 59 | Admitting: Neurology

## 2019-07-08 ENCOUNTER — Telehealth: Payer: Self-pay

## 2019-07-08 VITALS — BP 132/92 | HR 70 | Ht 62.0 in | Wt 297.4 lb

## 2019-07-08 DIAGNOSIS — G8929 Other chronic pain: Secondary | ICD-10-CM

## 2019-07-08 DIAGNOSIS — G5603 Carpal tunnel syndrome, bilateral upper limbs: Secondary | ICD-10-CM | POA: Diagnosis not present

## 2019-07-08 NOTE — Patient Instructions (Addendum)
1.  I think you need to be treated by a pain specialist. [facility]. [phone]

## 2019-07-08 NOTE — Progress Notes (Signed)
NEUROLOGY CONSULTATION NOTE  CHANDRA ASHER MRN: [id number] DOB: 01/03/1966  Referring provider: Boyd Kerbs, MD Primary care provider: No PCP  Reason for consult:  Neuropathy, radiculopathy, carpal tunnel syndrome  HISTORY OF PRESENT ILLNESS: Karen Cuevas is a 54 year old female who presents for above.  History supplemented by referring provider's notes.  She has had generalized pain for several years.  She reports neck and low back pain but denies radicular pain down the arms and legs.  She endorses sharp pain in the hands and wrists with associated hand numbness at night while in bed.  Wearing wrist splints at night helps.  She does not wish to pursue surgery for carpal tunnel syndrome.  She occasionally has sharp pain in her feet.  She was seen at [facility] in [place].  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  She has not had imaging of her cervical or lumbar spine.  She was treated with B12 injections which helped her fatigue but not so much her pain.  She has been taking hydrocodone, which is the most effective in treating her pain.  She also takes Flexeril and several NSAIDs such as ibuprofen 800mg , diclofenac and naproxen.  Past medications include Lyrica, gabapentin, Cymbalta, and meloxicam.  More recently, she has been treated with steroid injection for arthritis in her right knee.  She was referred to establish care in treating her pain as she changed insurance that is not covered by her prior neurologist.  PAST MEDICAL HISTORY: Past Medical History:  Diagnosis Date  . Arthritis   . Carpal tunnel syndrome     PAST SURGICAL HISTORY: No past surgical history on file.  MEDICATIONS: Current Outpatient Medications on File Prior to Visit  Medication Sig  Dispense Refill  . diclofenac Sodium (VOLTAREN) 1 % GEL Apply 2 g topically 4 (four) times daily as needed. 50 g 0  . Homeopathic Products (BHI BACK PAIN RELIEF) TABS Take 1-2 tablets by mouth every 6 (six) hours as needed (pain).    Marland Kitchen ibuprofen (ADVIL) 800 MG tablet Take 1 tablet (800 mg total) by mouth every 8 (eight) hours as needed. 21 tablet 0  . naproxen sodium (ANAPROX) 220 MG tablet Take 220-660 mg by mouth every 6 (six) hours as needed (pain).     No current facility-administered medications on file prior to visit.    ALLERGIES: Allergies  Allergen Reactions  . Penicillins Hives and Itching    Has patient had a PCN reaction causing immediate rash, facial/tongue/throat swelling, SOB or lightheadedness with hypotension: Yes Has patient had a PCN reaction causing severe rash involving mucus membranes or skin necrosis: No Has patient had a PCN reaction that required hospitalization Yes- ed visit Has patient had a PCN reaction occurring within the last 10 years: No, childhood allergy.  Pt states " i dont want it"     FAMILY HISTORY: No family history on file.  SOCIAL HISTORY: Social History   Socioeconomic History  . Marital status: Single    Spouse name: Not on file  . Number of children: Not on file  . Years of education: Not on file  . Highest education level: Not on file  Occupational History  . Not on file  Tobacco Use  . Smoking status: Never Smoker  . Smokeless tobacco: Never Used  Substance and Sexual Activity  . Alcohol use: No  .  Drug use: No  . Sexual activity: Not on file  Other Topics Concern  . Not on file  Social History Narrative  . Not on file   Social Determinants of Health   Financial Resource Strain:   . Difficulty of Paying Living Expenses:   Food Insecurity:   . Worried About Programme researcher, broadcasting/film/video in the Last Year:   . Barista in the Last Year:   Transportation Needs:   . Freight forwarder (Medical):   Marland Kitchen Lack of  Transportation (Non-Medical):   Physical Activity:   . Days of Exercise per Week:   . Minutes of Exercise per Session:   Stress:   . Feeling of Stress :   Social Connections:   . Frequency of Communication with Friends and Family:   . Frequency of Social Gatherings with Friends and Family:   . Attends Religious Services:   . Active Member of Clubs or Organizations:   . Attends Banker Meetings:   Marland Kitchen Marital Status:   Intimate Partner Violence:   . Fear of Current or Ex-Partner:   . Emotionally Abused:   Marland Kitchen Physically Abused:   . Sexually Abused:     PHYSICAL EXAM: Blood pressure (!) 132/92, pulse 70, height 5\' 2"  (1.575 m), weight 297 lb 6.4 oz (134.9 kg), last menstrual period 11/23/2014, SpO2 98 %. General: No acute distress.  Patient appears well-groomed.   Head:  Normocephalic/atraumatic Eyes:  Bilateral exophthalmos.  fundi examined but not visualized Neck: supple, no paraspinal tenderness, full range of motion Back: No paraspinal tenderness Heart: regular rate and rhythm Lungs: Clear to auscultation bilaterally. Vascular: No carotid bruits. Neurological Exam: Mental status: alert and oriented to person, place, and time, recent and remote memory intact, fund of knowledge intact, attention and concentration intact, speech fluent and not dysarthric, language intact. Cranial nerves: CN I: not tested CN II: pupils equal, round and reactive to light, visual fields intact CN III, IV, VI:  full range of motion, no nystagmus, bilateral ptosis CN V: facial sensation intact CN VII: upper and lower face symmetric CN VIII: hearing intact CN IX, X: gag intact, uvula midline CN XI: sternocleidomastoid and trapezius muscles intact CN XII: tongue midline Bulk & Tone: normal, no fasciculations. Motor:  Decreased effort in right knee flexion/extension due to pain, otherwise 5/5 throughout  Sensation:  Pinprick and vibration sensation intact. Deep Tendon Reflexes:  2+  throughout, toes downgoing.  Finger to nose testing:  Without dysmetria.  Heel to shin:  Without dysmetria.  Gait:  Wide-based antalgic gait.  Romberg negative.  IMPRESSION: Chronic pain syndrome.   Bilateral carpal tunnel syndrome.  Her main complaint is pain.  Much of her pain is not specifically related to neuropathy.  She reports non-radiating pain involving her neck, low back, right foot and right knee.  She does not exhibit any significant sensory deficits on my exam.  I believe she primarily needs to be treated by a pain specialist.  She has tried most of the neuropathic pain medications that I prescribe.  She finds that the hydrocodone is most helpful.  Our practice does not prescribe opioids.  I will refer her to a pain specialist.  In the meantime, her prior neurologist should continue prescribing her pain medication until then.   Thank you for allowing me to take part in the care of this patient.  01/23/2015, DO  CC: Shon Millet, MD

## 2019-07-08 NOTE — Telephone Encounter (Signed)
I called pt she is s/p knee injection and wants to know how long she should give this to work. I advised to touch base with me on Monday and let me know how she does and can make return appt if need to discuss additional treatment options.

## 2019-07-08 NOTE — Telephone Encounter (Signed)
Patient called Triage line and states she would like to speak to you. Advised her that you were in clinic. I asked her what message can I leave.  She states she prefers speaking to you about it. Didn't want to give me any details.    CB [phone]

## 2019-07-13 ENCOUNTER — Telehealth: Payer: Self-pay

## 2019-07-13 NOTE — Telephone Encounter (Signed)
Tried calling pt. [facility] referral cancelled due to of network with insurance. Pt has [employer]. Need to know what pain management clinic In [facility]

## 2019-07-13 NOTE — Telephone Encounter (Signed)
Message left by Kathie Rhodes with [facility]: Pt insurance is OON with the Practice. So they are unable to see the pt.   Will start another referral.

## 2019-07-16 ENCOUNTER — Ambulatory Visit: Payer: Self-pay | Admitting: Neurology

## 2019-07-23 ENCOUNTER — Other Ambulatory Visit: Payer: Self-pay

## 2019-07-23 ENCOUNTER — Telehealth: Payer: Self-pay | Admitting: Neurology

## 2019-07-23 DIAGNOSIS — G8929 Other chronic pain: Secondary | ICD-10-CM

## 2019-07-23 NOTE — Telephone Encounter (Signed)
Patient found pain location clinic that takes her insurance, unsure of name of facility but was given # [phone]. Stated it is in [facility] network. Please send referral there.

## 2019-07-23 NOTE — Telephone Encounter (Signed)
New Referral made to [facility]

## 2019-08-02 ENCOUNTER — Encounter: Payer: Self-pay | Admitting: Orthopedic Surgery

## 2019-08-02 ENCOUNTER — Ambulatory Visit (INDEPENDENT_AMBULATORY_CARE_PROVIDER_SITE_OTHER): Payer: 59 | Admitting: Orthopedic Surgery

## 2019-08-02 ENCOUNTER — Other Ambulatory Visit: Payer: Self-pay

## 2019-08-02 VITALS — Ht 62.0 in | Wt 297.0 lb

## 2019-08-02 DIAGNOSIS — M1711 Unilateral primary osteoarthritis, right knee: Secondary | ICD-10-CM | POA: Diagnosis not present

## 2019-08-02 NOTE — Progress Notes (Signed)
   Office Visit Note   Patient: Karen Cuevas           Date of Birth: 11/06/1965           MRN: [id number] Visit Date: 08/02/2019              Requested by: No referring provider defined for this encounter. PCP: Patient, No Pcp Per  Chief Complaint  Patient presents with  . Right Knee - Pain, Follow-up      HPI: Patient is a 54 year old woman who presents in follow-up status post injection right knee for osteoarthritis.  Patient states that it took about a week to start working she states her knee feels much better she states she still has some pain.  Assessment & Plan: Visit Diagnoses:  1. Unilateral primary osteoarthritis, right knee     Plan: Recommended trying water aerobics and exercise at the [facility] she will go to see if she can get some assistance for this.  She is given a note so she can use her croc shoes at work.  Follow-up if she needs another injection.  We discussed weight loss.  Follow-Up Instructions: Return if symptoms worsen or fail to improve.   Ortho Exam  Patient is alert, oriented, no adenopathy, well-dressed, normal affect, normal respiratory effort. Examination patient has an antalgic gait on the right there is no effusion there is no tenderness to palpation around the knee.  Imaging: No results found. No images are attached to the encounter.  Labs: No results found for: HGBA1C, ESRSEDRATE, CRP, LABURIC, REPTSTATUS, GRAMSTAIN, CULT, LABORGA   No results found for: ALBUMIN, PREALBUMIN, LABURIC  No results found for: MG No results found for: VD25OH  No results found for: PREALBUMIN CBC EXTENDED 11/24/2007  WBC 2.8(L)  RBC 4.03  HGB 13.0  HCT 37.5  PLT 215     Body mass index is 54.32 kg/m.  Orders:  No orders of the defined types were placed in this encounter.  No orders of the defined types were placed in this encounter.    Procedures: No procedures performed  Clinical Data: No additional findings.  ROS:  All other systems  negative, except as noted in the HPI. Review of Systems  Objective: Vital Signs: Ht 5\' 2"  (1.575 m)   Wt 297 lb (134.7 kg)   LMP 11/23/2014   BMI 54.32 kg/m   Specialty Comments:  No specialty comments available.  PMFS History: There are no problems to display for this patient.  Past Medical History:  Diagnosis Date  . Arthritis   . Carpal tunnel syndrome     Family History  Problem Relation Age of Onset  . Hypertension Other   . Diabetes Other     History reviewed. No pertinent surgical history. Social History   Occupational History  . Not on file  Tobacco Use  . Smoking status: Never Smoker  . Smokeless tobacco: Never Used  Substance and Sexual Activity  . Alcohol use: No  . Drug use: No  . Sexual activity: Not on file

## 2019-08-09 ENCOUNTER — Other Ambulatory Visit: Payer: Self-pay

## 2019-08-09 ENCOUNTER — Telehealth: Payer: Self-pay | Admitting: Neurology

## 2019-08-09 DIAGNOSIS — G5603 Carpal tunnel syndrome, bilateral upper limbs: Secondary | ICD-10-CM

## 2019-08-09 DIAGNOSIS — G8929 Other chronic pain: Secondary | ICD-10-CM

## 2019-08-09 NOTE — Progress Notes (Signed)
New Order added. Pt unable to be seen by [facility].

## 2019-08-09 NOTE — Telephone Encounter (Signed)
Pt advised of referral note. New Order added for [facility] will see if they will see pt. This is the last referral due to pt insurance only allowing [facility] network.

## 2019-08-09 NOTE — Telephone Encounter (Signed)
Patient is calling to speak to someone about this matter she needs help understanding what is going on. Please call her today

## 2019-08-09 NOTE — Telephone Encounter (Signed)
Patient called with concerns that her referral for pain management was declined. She said, "I called today to schedule and was told they would not see my but not the reason why. I need to see someone for my pain."

## 2019-08-31 ENCOUNTER — Telehealth: Payer: Self-pay | Admitting: Neurology

## 2019-08-31 NOTE — Telephone Encounter (Signed)
Called [facility] and confirmed that pt has been referred to different clinic that accepts her insurance and does not need to be scheduled in their office.Marland Kitchen

## 2019-08-31 NOTE — Telephone Encounter (Signed)
Karen Cuevas with [facility] called and left a message about the referral we sent them for this patient. They do not take the patient's insurance [employer] and cannot schedule.

## 2020-02-07 ENCOUNTER — Ambulatory Visit (INDEPENDENT_AMBULATORY_CARE_PROVIDER_SITE_OTHER): Payer: 59

## 2020-02-07 ENCOUNTER — Encounter: Payer: Self-pay | Admitting: Orthopedic Surgery

## 2020-02-07 ENCOUNTER — Ambulatory Visit (INDEPENDENT_AMBULATORY_CARE_PROVIDER_SITE_OTHER): Payer: 59 | Admitting: Orthopedic Surgery

## 2020-02-07 DIAGNOSIS — M25571 Pain in right ankle and joints of right foot: Secondary | ICD-10-CM

## 2020-02-07 DIAGNOSIS — M76829 Posterior tibial tendinitis, unspecified leg: Secondary | ICD-10-CM | POA: Diagnosis not present

## 2020-02-07 NOTE — Progress Notes (Signed)
Office Visit Note   Patient: Karen Cuevas           Date of Birth: Jun 09, 1965           MRN: [id number] Visit Date: 02/07/2020              Requested by: No referring provider defined for this encounter. PCP: Patient, No Pcp Per  Chief Complaint  Patient presents with  . Right Ankle - Pain      HPI: Patient is a 54 year old woman who presents with worsening pain over the sinus Tarsi she states she is on her feet all day at work and this causes increased pain laterally.  Patient has had an injection in the past which did provide her some temporary relief from the subtalar impingement.  She states she has tried different types of shoes and inserts without relief.  Assessment & Plan: Visit Diagnoses:  1. Pain in right ankle and joints of right foot   2. Posterior tibialis tendon insufficiency     Plan: Recommended trying a [place]oka sneaker with over-the-counter orthotics.  Discussed that if the step  Follow-Up Instructions: No follow-ups on file.   Ortho Exam  Patient is alert, oriented, no adenopathy, well-dressed, normal affect, normal respiratory effort. Examination patient's foot is neurovascular intact she has a positive too many toes sign with pronation and valgus and pes planus.  She cannot do a single limb heel raise she has tenderness to palpation over the sinus Tarsi with subtalar pain she has decreased subtalar motion.  Imaging: XR Ankle 2 Views Right  Result Date: 02/07/2020 2 view radiographs of the right f knee Fettig bone spurs at the talonavicular joint oot shows collapse of the subtalar joint as well as  No images are attached to the encounter.  Labs: No results found for: HGBA1C, ESRSEDRATE, CRP, LABURIC, REPTSTATUS, GRAMSTAIN, CULT, LABORGA For sneaker with orthotics is not beneficial surgical intervention would include a talonavicular fusion and subtalar fusion discussed that she would be off her foot for about 6 to 8 weeks.  No results found for:  ALBUMIN, PREALBUMIN, LABURIC  No results found for: MG No results found for: VD25OH  No results found for: PREALBUMIN CBC EXTENDED 11/24/2007  WBC 2.8(L)  RBC 4.03  HGB 13.0  HCT 37.5  PLT 215     There is no height or weight on file to calculate BMI.  Orders:  Orders Placed This Encounter  Procedures  . XR Ankle 2 Views Right   No orders of the defined types were placed in this encounter.    Procedures: No procedures performed  Clinical Data: No additional findings.  ROS:  All other systems negative, except as noted in the HPI. Review of Systems  Objective: Vital Signs: LMP 11/23/2014   Specialty Comments:  No specialty comments available.  PMFS History: There are no problems to display for this patient.  Past Medical History:  Diagnosis Date  . Arthritis   . Carpal tunnel syndrome     Family History  Problem Relation Age of Onset  . Hypertension Other   . Diabetes Other     History reviewed. No pertinent surgical history. Social History   Occupational History  . Not on file  Tobacco Use  . Smoking status: Never Smoker  . Smokeless tobacco: Never Used  Vaping Use  . Vaping Use: Never used  Substance and Sexual Activity  . Alcohol use: No  . Drug use: No  . Sexual activity: Not on  file

## 2020-02-08 ENCOUNTER — Telehealth: Payer: Self-pay | Admitting: Orthopedic Surgery

## 2020-02-08 NOTE — Telephone Encounter (Signed)
Pt called asking for a CB in regards to a letter Dr.Dudua wrote they gave her permission to wear crocs at work.  [phone]

## 2020-02-15 ENCOUNTER — Telehealth: Payer: Self-pay | Admitting: Orthopedic Surgery

## 2020-02-15 ENCOUNTER — Other Ambulatory Visit: Payer: Self-pay

## 2020-02-15 NOTE — Telephone Encounter (Signed)
Note was faxed per pt request.

## 2020-02-15 NOTE — Telephone Encounter (Signed)
Pt will call with fax number to employer she misplaced the note Dr. Lajoyce Corners wrote for her allowing her to wear crocks to work. She will call with fax number and I will fax note today.

## 2020-02-15 NOTE — Telephone Encounter (Signed)
Patient called. She wanted Karen Cuevas to know that the fax number for [employer]in [employer] is [phone]

## 2020-02-15 NOTE — Telephone Encounter (Signed)
Faxed note per request

## 2021-02-13 ENCOUNTER — Ambulatory Visit (INDEPENDENT_AMBULATORY_CARE_PROVIDER_SITE_OTHER): Payer: 59

## 2021-02-13 ENCOUNTER — Ambulatory Visit (INDEPENDENT_AMBULATORY_CARE_PROVIDER_SITE_OTHER): Payer: 59 | Admitting: Orthopedic Surgery

## 2021-02-13 ENCOUNTER — Other Ambulatory Visit: Payer: Self-pay

## 2021-02-13 DIAGNOSIS — M1711 Unilateral primary osteoarthritis, right knee: Secondary | ICD-10-CM | POA: Diagnosis not present

## 2021-02-13 DIAGNOSIS — M25571 Pain in right ankle and joints of right foot: Secondary | ICD-10-CM

## 2021-02-14 ENCOUNTER — Encounter: Payer: Self-pay | Admitting: Orthopedic Surgery

## 2021-02-14 DIAGNOSIS — M25571 Pain in right ankle and joints of right foot: Secondary | ICD-10-CM | POA: Diagnosis not present

## 2021-02-14 DIAGNOSIS — M1711 Unilateral primary osteoarthritis, right knee: Secondary | ICD-10-CM

## 2021-02-14 MED ORDER — METHYLPREDNISOLONE ACETATE 40 MG/ML IJ SUSP
40.0000 mg | INTRAMUSCULAR | Status: AC | PRN
Start: 2021-02-14 — End: 2021-02-14
  Administered 2021-02-14: 40 mg via INTRA_ARTICULAR

## 2021-02-14 MED ORDER — METHYLPREDNISOLONE ACETATE 40 MG/ML IJ SUSP
40.0000 mg | INTRAMUSCULAR | Status: AC | PRN
  Administered 2021-02-14: 40 mg via INTRA_ARTICULAR

## 2021-02-14 MED ORDER — LIDOCAINE HCL (PF) 1 % IJ SOLN
5.0000 mL | INTRAMUSCULAR | Status: AC | PRN
  Administered 2021-02-14: 5 mL

## 2021-02-14 MED ORDER — LIDOCAINE HCL 1 % IJ SOLN
2.0000 mL | INTRAMUSCULAR | Status: AC | PRN
Start: 2021-02-14 — End: 2021-02-14
  Administered 2021-02-14: 2 mL

## 2021-02-14 NOTE — Progress Notes (Signed)
Office Visit Note   Patient: Karen Cuevas           Date of Birth: 07/02/1965           MRN: [id number] Visit Date: 02/13/2021              Requested by: No referring provider defined for this encounter. PCP: Patient, No Pcp Per (Inactive)  Chief Complaint  Patient presents with   Right Ankle - Follow-up, Pain   Right Knee - Pain      HPI: Patient is a 55 year old woman who presents with chronic arthritis and pain right knee and right ankle.  Patient states its been over a year since her last intra-articular injection.  Assessment & Plan: Visit Diagnoses:  1. Unilateral primary osteoarthritis, right knee   2. Pain in right ankle and joints of right foot     Plan: The right ankle and right knee were injected patient tolerated this well recommended ice tonight.  Follow-Up Instructions: Return if symptoms worsen or fail to improve.   Ortho Exam  Patient is alert, oriented, no adenopathy, well-dressed, normal affect, normal respiratory effort. Examination patient has good range of motion of the right ankle anterior drawer stable she is tender to palpation anteriorly over the joint line.  Semination of the right knee she has crepitation with range of motion collaterals are cruciates are stable there is tenderness to palpation of the medial and lateral joint line as well as the patellofemoral joint.  Imaging: XR Ankle 2 Views Right  Result Date: 02/14/2021 2 view radiographs of the right ankle shows arthritic changes with osteophytic bone spurs anteriorly.  XR Knee 1-2 Views Right  Result Date: 02/14/2021 2 view radiographs of the right knee shows tricompartmental arthritic changes with osteophytic bone spurs in all 3 compartments subcondylar sclerosis and cysts.  No images are attached to the encounter.  Labs: No results found for: HGBA1C, ESRSEDRATE, CRP, LABURIC, REPTSTATUS, GRAMSTAIN, CULT, LABORGA   No results found for: ALBUMIN, PREALBUMIN, CBC  No results  found for: MG No results found for: VD25OH  No results found for: PREALBUMIN CBC EXTENDED 11/24/2007  WBC 2.8(L)  RBC 4.03  HGB 13.0  HCT 37.5  PLT 215     There is no height or weight on file to calculate BMI.  Orders:  Orders Placed This Encounter  Procedures   Large Joint Inj   Medium Joint Inj   XR Ankle 2 Views Right   XR Knee 1-2 Views Right   No orders of the defined types were placed in this encounter.    Procedures: Large Joint Inj: R knee on 02/14/2021 3:13 PM Indications: pain and diagnostic evaluation Details: 22 G 1.5 in needle, anteromedial approach  Arthrogram: No  Medications: 5 mL lidocaine (PF) 1 %; 40 mg methylPREDNISolone acetate 40 MG/ML Outcome: tolerated well, no immediate complications Procedure, treatment alternatives, risks and benefits explained, specific risks discussed. Consent was given by the patient. Immediately prior to procedure a time out was called to verify the correct patient, procedure, equipment, support staff and site/side marked as required. Patient was prepped and draped in the usual sterile fashion.    Medium Joint Inj: R ankle on 02/14/2021 3:13 PM Indications: pain and diagnostic evaluation Details: 22 G 1.5 in needle, anteromedial approach Medications: 2 mL lidocaine 1 %; 40 mg methylPREDNISolone acetate 40 MG/ML Outcome: tolerated well, no immediate complications Procedure, treatment alternatives, risks and benefits explained, specific risks discussed. Consent was given by the patient.  Immediately prior to procedure a time out was called to verify the correct patient, procedure, equipment, support staff and site/side marked as required. Patient was prepped and draped in the usual sterile fashion.     Clinical Data: No additional findings.  ROS:  All other systems negative, except as noted in the HPI. Review of Systems  Objective: Vital Signs: LMP 11/23/2014   Specialty Comments:  No specialty comments  available.  PMFS History: There are no problems to display for this patient.  Past Medical History:  Diagnosis Date   Arthritis    Carpal tunnel syndrome     Family History  Problem Relation Age of Onset   Hypertension Other    Diabetes Other     History reviewed. No pertinent surgical history. Social History   Occupational History   Not on file  Tobacco Use   Smoking status: Never   Smokeless tobacco: Never  Vaping Use   Vaping Use: Never used  Substance and Sexual Activity   Alcohol use: No   Drug use: No   Sexual activity: Not on file

## 2021-02-22 ENCOUNTER — Telehealth: Payer: Self-pay | Admitting: Orthopedic Surgery

## 2021-02-22 NOTE — Telephone Encounter (Signed)
Pt called requesting to speak with Autumn F. Pt states injection didn't work and need medical advice. Please call pt at [phone]

## 2021-02-23 ENCOUNTER — Telehealth: Payer: Self-pay | Admitting: Orthopedic Surgery

## 2021-02-23 NOTE — Telephone Encounter (Signed)
Can you please call this pt and advise that we will hold this message for Monday and discuss next steps with Dr. Lajoyce Corners please?

## 2021-02-23 NOTE — Telephone Encounter (Signed)
lmtcb

## 2021-02-23 NOTE — Telephone Encounter (Signed)
Lmtcb.

## 2021-02-23 NOTE — Telephone Encounter (Signed)
Duplicate message, see previous telephone call.

## 2021-02-23 NOTE — Telephone Encounter (Signed)
I SW with pt, she is s/p right knee inj on 02/13/21. It took two weeks for full benefit. Her knee is tight and cracks.She had gel injection about 17 months ago per patient. Worked well. I told her we would get further steps for her on Monday.

## 2021-02-23 NOTE — Telephone Encounter (Signed)
Patient called. Would like [place] to call her. [phone]

## 2021-02-26 NOTE — Telephone Encounter (Signed)
Pt would like to know the next steps of her right knee. Came in for cortisone shot on 02/13/21, didn't help her.

## 2021-02-26 NOTE — Telephone Encounter (Signed)
Pt doesn't want to have a knee replacement. I did suggest she could try gel injections again after 03/21/21. We can't put in requests for the remainder of the year for insurance coverage. She did state she is feeling a bit better since last week.

## 2021-07-22 ENCOUNTER — Other Ambulatory Visit: Payer: Self-pay

## 2021-07-22 ENCOUNTER — Emergency Department (HOSPITAL_BASED_OUTPATIENT_CLINIC_OR_DEPARTMENT_OTHER)
Admission: EM | Admit: 2021-07-22 | Discharge: 2021-07-22 | Disposition: A | Discharge status: Home / Self Care | Payer: 59 | Attending: Emergency Medicine | Admitting: Emergency Medicine

## 2021-07-22 ENCOUNTER — Telehealth (HOSPITAL_BASED_OUTPATIENT_CLINIC_OR_DEPARTMENT_OTHER): Payer: Self-pay | Admitting: Emergency Medicine

## 2021-07-22 ENCOUNTER — Encounter (HOSPITAL_BASED_OUTPATIENT_CLINIC_OR_DEPARTMENT_OTHER): Payer: Self-pay | Admitting: Emergency Medicine

## 2021-07-22 DIAGNOSIS — X58XXXA Exposure to other specified factors, initial encounter: Secondary | ICD-10-CM | POA: Insufficient documentation

## 2021-07-22 DIAGNOSIS — S0502XA Injury of conjunctiva and corneal abrasion without foreign body, left eye, initial encounter: Secondary | ICD-10-CM | POA: Insufficient documentation

## 2021-07-22 DIAGNOSIS — S0502XD Injury of conjunctiva and corneal abrasion without foreign body, left eye, subsequent encounter: Secondary | ICD-10-CM

## 2021-07-22 MED ORDER — TETRACAINE HCL 0.5 % OP SOLN
2.0000 [drp] | Freq: Once | OPHTHALMIC | Status: AC
  Administered 2021-07-22: 2 [drp] via OPHTHALMIC
  Filled 2021-07-22: qty 4

## 2021-07-22 MED ORDER — LEVOFLOXACIN 0.5 % OP SOLN: 2.0000 [drp] | OPHTHALMIC | 0 refills | Status: AC

## 2021-07-22 MED ORDER — CYCLOPENTOLATE HCL 1 % OP SOLN: 1.0000 [drp] | Freq: Three times a day (TID) | OPHTHALMIC | 0 refills | Status: AC | PRN

## 2021-07-22 MED ORDER — FLUORESCEIN SODIUM 1 MG OP STRP
2.0000 | ORAL_STRIP | Freq: Once | OPHTHALMIC | Status: AC
  Administered 2021-07-22: 2 via OPHTHALMIC
  Filled 2021-07-22: qty 2

## 2021-07-22 NOTE — ED Triage Notes (Signed)
Pt arrives pov, slow gait c/o left eye pain, drainage and pressure x 2 days. Denies injury. Redness noted, endorses photo sensitivity. 200 mg ibuprofen pta ?

## 2021-07-22 NOTE — ED Notes (Signed)
Pt signed that she understood discharge instructions. Signature pad not working ? ?

## 2021-07-22 NOTE — Discharge Instructions (Addendum)
Your exam showed you have a corneal abrasion in the center of that left eye.  Antibiotic eyedrops along with eyedrops for pain have been sent to your pharmacy.  If you have any worsening symptoms please return to the emergency room for evaluation.  Otherwise have attached ophthalmology follow-up for you above.  You can give them a call to schedule a follow-up appointment. ?

## 2021-07-22 NOTE — ED Provider Notes (Addendum)
?[facility] EMERGENCY DEPARTMENT ?Provider Note ? ? ?CSN: [id number] ?Arrival date & time: 07/22/21  1235 ? ?  ? ?History ? ?Chief Complaint  ?Patient presents with  ? Eye Problem  ? ? ?Karen Cuevas is a 56 y.o. female. ? ?56 year old female presents today for evaluation of left eye pain and drainage.  This started yesterday.  She denies fever, headache, nausea, or vomiting.  Patient does wear contacts.  Patient states she has not slept in her contacts.  Contact is currently not in.  She does not have any corrective lenses with her.  Denies pain with eye movements. ? ?The history is provided by the patient. No language interpreter was used.  ? ?  ? ?Home Medications ?Prior to Admission medications   ?Medication Sig Start Date End Date Taking? Authorizing Provider  ?cyclobenzaprine (FLEXERIL) 10 MG tablet Take 10 mg by mouth 3 (three) times daily.    [provider]  ?diclofenac (VOLTAREN) 75 MG EC tablet Take 75 mg by mouth daily.    [provider]  ?diclofenac Sodium (VOLTAREN) 1 % GEL Apply 2 g topically 4 (four) times daily as needed. 06/25/19   Long, Wonda Olds, MD  ?DULoxetine (CYMBALTA) 60 MG capsule Take 60 mg by mouth 3 (three) times daily.    [provider]  ?Homeopathic Products (BHI BACK PAIN RELIEF) TABS Take 1-2 tablets by mouth every 6 (six) hours as needed (pain).    [provider]  ?HYDROcodone-acetaminophen (NORCO) 10-325 MG tablet Take 10-325 mg by mouth in the morning and at bedtime.    [provider]  ?ibuprofen (ADVIL) 800 MG tablet Take 1 tablet (800 mg total) by mouth every 8 (eight) hours as needed. 06/25/19   Long, Wonda Olds, MD  ?naproxen sodium (ANAPROX) 220 MG tablet Take 220-660 mg by mouth every 6 (six) hours as needed (pain).    [provider]  ?   ? ?Allergies    ?Penicillins   ? ?Review of Systems   ?Review of Systems  ?HENT:  Negative for congestion.   ?Eyes:  Positive for discharge and redness. Negative for photophobia,  pain, itching and visual disturbance.  ?Neurological:  Negative for headaches.  ?All other systems reviewed and are negative. ? ?Physical Exam ?Updated Vital Signs ?BP (!) 145/77 (BP Location: Left Arm)   Pulse 70   Temp 98.2 ?F (36.8 ?C) (Oral)   Resp 18   Ht 5\' 5"  (1.651 m)   Wt (!) 138.3 kg   LMP 11/23/2014   SpO2 100%   BMI 50.75 kg/m?  ?Physical Exam ?Vitals and nursing note reviewed.  ?Constitutional:   ?   General: She is not in acute distress. ?   Appearance: Normal appearance. She is not ill-appearing.  ?HENT:  ?   Head: Normocephalic and atraumatic.  ?   Nose: Nose normal.  ?Eyes:  ?   General: Lids are normal. Lids are everted, no foreign bodies appreciated. Vision grossly intact. Gaze aligned appropriately. No allergic shiner or visual field deficit. ?   Intraocular pressure: Left eye pressure is 14 mmHg. Measurements were taken using a handheld tonometer. ?   Extraocular Movements: Extraocular movements intact.  ?   Right eye: Normal extraocular motion and no nystagmus.  ?   Left eye: Normal extraocular motion and no nystagmus.  ?   Conjunctiva/sclera:  ?   Left eye: Left conjunctiva is injected.  ?Pulmonary:  ?   Effort: Pulmonary effort is normal. No respiratory distress.  ?  Musculoskeletal:     ?   General: No deformity.  ?Skin: ?   Findings: No rash.  ?Neurological:  ?   Mental Status: She is alert.  ? ? ?ED Results / Procedures / Treatments   ?Labs ?(all labs ordered are listed, but only abnormal results are displayed) ?Labs Reviewed - No data to display ? ?EKG ?None ? ?Radiology ?No results found. ? ?Procedures ?Procedures  ? ? ?Medications Ordered in ED ?Medications  ?tetracaine (PONTOCAINE) 0.5 % ophthalmic solution 2 drop (2 drops Left Eye Given by Other 07/22/21 1401)  ?fluorescein ophthalmic strip 2 strip (2 strips Left Eye Given by Other 07/22/21 1402)  ? ? ?ED Course/ Medical Decision Making/ A&P ?  ?                        ?Medical Decision Making ?Risk ?Prescription drug  management. ? ? ?56 year old female presents today for evaluation of left eye redness and drainage.  This started yesterday.  Patient does wear contacts however does not currently have a man.  Denies sleeping in her contacts.  Denies headache, visual change, or other complaints.  Some evidence of eye drainage noted on exam.  Woods lamp with evidence of corneal abrasion in the center of the eye.  Will provide patient with antibiotic eye ointment.  Patient most likely has conjunctivitis.  Return precautions discussed.  Patient voices understanding and is in agreement with plan.  Patient does not have other corrective lenses and requests 2 days off of work. ? ?Received phone call from pharmacy.  They do not have levofloxacin ophthalmic solution.  Switch patient over to moxifloxacin eyedrops.  Given patient wears contact lenses these antibiotics were used to cover for Pseudomonas. ? ? ?Final Clinical Impression(s) / ED Diagnoses ?Final diagnoses:  ?Abrasion of left cornea, initial encounter  ? ? ?Rx / DC Orders ?ED Discharge Orders   ? ?      Ordered  ?  levofloxacin (QUIXIN) 0.5 % ophthalmic solution  Every 2 hours       ? 07/22/21 1447  ?  cyclopentolate (CYCLODRYL,CYCLOGYL) 1 % ophthalmic solution  Every 8 hours PRN       ? 07/22/21 1447  ? ?  ?  ? ?  ? ? ?  ?Evlyn Courier, PA-C ?07/22/21 1450 ? ?  ?Evlyn Courier, PA-C ?07/22/21 1610 ? ?  ?Lajean Saver, MD ?07/23/21 1350 ? ?

## 2021-07-24 NOTE — Telephone Encounter (Signed)
Received call from pharmacy stating they do not have levofloxacin eyedrops in stock.  However they do have moxifloxacin.  Prescription changed to moxifloxacin. ?

## 2021-08-16 ENCOUNTER — Other Ambulatory Visit: Payer: Self-pay

## 2021-08-16 ENCOUNTER — Emergency Department (HOSPITAL_BASED_OUTPATIENT_CLINIC_OR_DEPARTMENT_OTHER)
Admission: EM | Admit: 2021-08-16 | Discharge: 2021-08-16 | Disposition: A | Discharge status: Home / Self Care | Payer: 59 | Attending: Emergency Medicine | Admitting: Emergency Medicine

## 2021-08-16 ENCOUNTER — Emergency Department (HOSPITAL_BASED_OUTPATIENT_CLINIC_OR_DEPARTMENT_OTHER): Payer: 59

## 2021-08-16 DIAGNOSIS — M47812 Spondylosis without myelopathy or radiculopathy, cervical region: Secondary | ICD-10-CM | POA: Diagnosis not present

## 2021-08-16 DIAGNOSIS — Y9389 Activity, other specified: Secondary | ICD-10-CM | POA: Insufficient documentation

## 2021-08-16 DIAGNOSIS — W19XXXA Unspecified fall, initial encounter: Secondary | ICD-10-CM

## 2021-08-16 DIAGNOSIS — M791 Myalgia, unspecified site: Secondary | ICD-10-CM | POA: Diagnosis present

## 2021-08-16 DIAGNOSIS — W01198A Fall on same level from slipping, tripping and stumbling with subsequent striking against other object, initial encounter: Secondary | ICD-10-CM | POA: Insufficient documentation

## 2021-08-16 MED ORDER — LIDOCAINE 5 % EX PTCH: 1.0000 | MEDICATED_PATCH | CUTANEOUS | 0 refills | Status: AC

## 2021-08-16 MED ORDER — CYCLOBENZAPRINE HCL 10 MG PO TABS: 10.0000 mg | ORAL_TABLET | Freq: Three times a day (TID) | ORAL | 0 refills | Status: AC

## 2021-08-16 NOTE — ED Provider Notes (Signed)
[facility] EMERGENCY DEPARTMENT Provider Note   CSN: [id number] Arrival date & time: 08/16/21  1610     History  Chief Complaint  Patient presents with   body pain    Karen Cuevas is a 56 y.o. female presenting after a fall that occurred yesterday.  She reports that she was getting into the bathtub when she slipped and hit her left head on the porcelain tub.  Endorses pain to her entire left body.  When she hit her head and her left eye momentarily went black and she got a nosebleed.  This was controlled with pressure.  Denies loss of consciousness.  Is not on blood thinners.  Took 800 mg ibuprofen and prescribed hydrocodone for her pain yesterday.  It somewhat improved but she was concerned about how hard she hit her head and her moment of vision loss so she came to the ED today.  HPI     Home Medications Prior to Admission medications   Medication Sig Start Date End Date Taking? Authorizing Provider  lidocaine (LIDODERM) 5 % Place 1 patch onto the skin daily. Remove & Discard patch within 12 hours or as directed by MD 08/16/21  Yes Jaime Dome A, PA-C  cyclobenzaprine (FLEXERIL) 10 MG tablet Take 1 tablet (10 mg total) by mouth 3 (three) times daily. 08/16/21   Jiali Linney A, PA-C  cyclopentolate (CYCLODRYL,CYCLOGYL) 1 % ophthalmic solution Place 1 drop into the left eye every 8 (eight) hours as needed. 07/22/21   Marita [place], PA-C  diclofenac (VOLTAREN) 75 MG EC tablet Take 75 mg by mouth daily.    [provider]  diclofenac Sodium (VOLTAREN) 1 % GEL Apply 2 g topically 4 (four) times daily as needed. 06/25/19   Long, Arlyss Repress, MD  DULoxetine (CYMBALTA) 60 MG capsule Take 60 mg by mouth 3 (three) times daily.    [provider]  Homeopathic Products (BHI BACK PAIN RELIEF) TABS Take 1-2 tablets by mouth every 6 (six) hours as needed (pain).    [provider]  HYDROcodone-acetaminophen (NORCO) 10-325 MG tablet Take 10-325 mg by mouth in the  morning and at bedtime.    [provider]  ibuprofen (ADVIL) 800 MG tablet Take 1 tablet (800 mg total) by mouth every 8 (eight) hours as needed. 06/25/19   Long, Arlyss Repress, MD  levofloxacin Charlean Sanfilippo) 0.5 % ophthalmic solution Place 2 drops into the left eye every 2 (two) hours. Place 2 drops into the left eye every 2 hours for 2 days then every 6 hours for 5 days after the first 2 days. 07/22/21   Marita [place], PA-C  naproxen sodium (ANAPROX) 220 MG tablet Take 220-660 mg by mouth every 6 (six) hours as needed (pain).    [provider]      Allergies    Penicillins    Review of Systems   Review of Systems  Physical Exam Updated Vital Signs BP 139/81 (BP Location: Left Arm)   Pulse 61   Temp 98.1 F (36.7 C) (Oral)   Resp 20   Ht 5\' 2"  (1.575 m)   Wt 135.2 kg   LMP 11/23/2014   SpO2 100%   BMI 54.50 kg/m  Physical Exam Vitals and nursing note reviewed.  Constitutional:      General: She is not in acute distress.    Appearance: Normal appearance. She is not ill-appearing.  HENT:     Head: Normocephalic and atraumatic.     Mouth/Throat:  Mouth: Mucous membranes are dry.     Pharynx: Oropharynx is clear.     Comments: All teeth in very poor dentition.  Multiple cavities and revisions.  No obvious abscess. Eyes:     General: No scleral icterus.    Extraocular Movements: Extraocular movements intact.     Conjunctiva/sclera: Conjunctivae normal.     Pupils: Pupils are equal, round, and reactive to light.  Pulmonary:     Effort: Pulmonary effort is normal. No respiratory distress.  Musculoskeletal:     Cervical back: Normal range of motion.     Comments: Tenderness to the left lateral muscles of the C-spine.  Paraspinal tenderness of T and L-spine.  Mild tenderness over the left hip, full range of motion of the joint.  Neurovascularly intact in all extremities.  No deformities.  Skin:    Findings: No rash.  Neurological:     General: No focal deficit present.      Mental Status: She is alert.     Cranial Nerves: No cranial nerve deficit.     Motor: No weakness.  Psychiatric:        Mood and Affect: Mood normal.    ED Results / Procedures / Treatments   Labs (all labs ordered are listed, but only abnormal results are displayed) Labs Reviewed - No data to display  EKG None  Radiology CT Head Wo Contrast  Result Date: 08/16/2021 CLINICAL DATA:  Larey Seat and hit left side of head yesterday EXAM: CT HEAD WITHOUT CONTRAST TECHNIQUE: Contiguous axial images were obtained from the base of the skull through the vertex without intravenous contrast. RADIATION DOSE REDUCTION: This exam was performed according to the departmental dose-optimization program which includes automated exposure control, adjustment of the mA and/or kV according to patient size and/or use of iterative reconstruction technique. COMPARISON:  None Available. FINDINGS: Brain: No acute infarct or hemorrhage. Lateral ventricles and midline structures are unremarkable. No acute extra-axial fluid collections. No mass effect. Vascular: No hyperdense vessel or unexpected calcification. Skull: Normal. Negative for fracture or focal lesion. Sinuses/Orbits: No acute finding. Other: None. IMPRESSION: 1. No acute intracranial process. Electronically Signed   By: Sharlet Salina M.D.   On: 08/16/2021 17:25   CT Cervical Spine Wo Contrast  Result Date: 08/16/2021 CLINICAL DATA:  Slipped and fell, hit left side of head yesterday EXAM: CT CERVICAL SPINE WITHOUT CONTRAST TECHNIQUE: Multidetector CT imaging of the cervical spine was performed without intravenous contrast. Multiplanar CT image reconstructions were also generated. RADIATION DOSE REDUCTION: This exam was performed according to the departmental dose-optimization program which includes automated exposure control, adjustment of the mA and/or kV according to patient size and/or use of iterative reconstruction technique. COMPARISON:  None Available.  FINDINGS: Alignment: Alignment is grossly anatomic. Skull base and vertebrae: No acute fracture. No primary bone lesion or focal pathologic process. Soft tissues and spinal canal: No prevertebral fluid or swelling. No visible canal hematoma. Disc levels: Prominent spondylosis at C5-6. Multilevel facet hypertrophy greatest at C2-3. Upper chest: Airway is patent.  Lung apices are clear. Other: Reconstructed images demonstrate no additional findings. IMPRESSION: 1. No acute cervical spine fracture. 2. Multilevel spondylosis and facet hypertrophy, greatest at C2-3 and C5-6. Electronically Signed   By: Sharlet Salina M.D.   On: 08/16/2021 17:27    Procedures Procedures   Medications Ordered in ED Medications - No data to display  ED Course/ Medical Decision Making/ A&P  Medical Decision Making Amount and/or Complexity of Data Reviewed Radiology: ordered.  Risk Prescription drug management.   56 year old female presenting today after a fall.  Reports it was mechanical.  Did consider seizure, syncope, arrhythmia, intoxication however more consistent with mechanical fall due to slipping on water.  Physical exam was performed annual nerves II through XII are intact and there were no other deficits.  There were no deformities or obvious injuries or bruising throughout the patient's body.  Tenderness to appear to be localized to the muscles  Treatment: Patient drove to the department today and recently took ibuprofen.  No further medications ordered  Imaging: CT head and neck ordered, reviewed and individually interpreted by me.  Negative.  Radiology notes some spondylosis of the cervical spine.  I agree.  MDM/disposition: Patient's imaging is negative.  She is ambulatory and neurologically intact.  She is agreeable to discharge home with Flexeril, which she is already prescribed and ibuprofen which she also already has.  I will send lidocaine patches as well.  We discussed  other at home remedies for her sore muscles and she will follow-up with her primary as needed going forward.  I also gave her resources for dentist due to her poor dentition.   Final Clinical Impression(s) / ED Diagnoses Final diagnoses:  Fall, initial encounter    Rx / DC Orders ED Discharge Orders          Ordered    lidocaine (LIDODERM) 5 %  Every 24 hours        08/16/21 1723    cyclobenzaprine (FLEXERIL) 10 MG tablet  3 times daily        08/16/21 1724           Results and diagnoses were explained to the patient. Return precautions discussed in full. Patient had no additional questions and expressed complete understanding.   This chart was dictated using voice recognition software.  Despite best efforts to proofread,  errors can occur which can change the documentation meaning.    Saddie BendersRedwine, Everlean Bucher A, PA-C 08/16/21 1800    Sloan LeiterGray, Samuel A, DO 08/18/21 1116

## 2021-08-16 NOTE — ED Triage Notes (Signed)
Patient states she slipped and fell and hit her head on the left hand side. No LOC. Patient c/o total body pain from fall yesterday. Patient A&OX4. No acute distress noted. NO blood thinners reported.

## 2021-08-16 NOTE — Discharge Instructions (Addendum)
Use the ibuprofen and Flexeril for pain.  As we discussed, do not take the Flexeril or working because it may make you drowsy.  Lidocaine patches are at the pharmacy.  You may also use heat packs.  I have also attached resources for dentists that may be able to help with your cavities.  Return with any worsening symptoms.

## 2021-09-06 ENCOUNTER — Encounter: Payer: Self-pay | Admitting: Orthopedic Surgery

## 2021-09-06 ENCOUNTER — Ambulatory Visit (INDEPENDENT_AMBULATORY_CARE_PROVIDER_SITE_OTHER): Payer: 59 | Admitting: Orthopedic Surgery

## 2021-09-06 DIAGNOSIS — M25571 Pain in right ankle and joints of right foot: Secondary | ICD-10-CM

## 2021-09-06 MED ORDER — LIDOCAINE HCL 1 % IJ SOLN
1.0000 mL | INTRAMUSCULAR | Status: AC | PRN
  Administered 2021-09-06: 1 mL

## 2021-09-06 MED ORDER — METHYLPREDNISOLONE ACETATE 40 MG/ML IJ SUSP
40.0000 mg | INTRAMUSCULAR | Status: AC | PRN
  Administered 2021-09-06: 40 mg via INTRA_ARTICULAR

## 2022-11-13 ENCOUNTER — Other Ambulatory Visit (HOSPITAL_COMMUNITY): Payer: Self-pay

## 2022-11-25 ENCOUNTER — Other Ambulatory Visit (HOSPITAL_COMMUNITY): Payer: Self-pay

## 2022-11-26 ENCOUNTER — Other Ambulatory Visit (HOSPITAL_COMMUNITY): Payer: Self-pay

## 2022-11-26 MED ORDER — GABAPENTIN 300 MG PO CAPS
600.0000 mg | ORAL_CAPSULE | Freq: Three times a day (TID) | ORAL | 0 refills | Status: DC
  Filled 2022-11-26: qty 180, 30d supply, fill #0

## 2022-11-26 MED ORDER — VITAMIN D (ERGOCALCIFEROL) 1.25 MG (50000 UNIT) PO CAPS
50000.0000 [IU] | ORAL_CAPSULE | ORAL | 3 refills | Status: AC
  Filled 2022-11-26: qty 12, 84d supply, fill #0
  Filled 2023-02-25 – 2023-03-18 (×2): qty 12, 84d supply, fill #1

## 2022-11-26 MED ORDER — OXYCODONE-ACETAMINOPHEN 10-325 MG PO TABS
1.5000 | ORAL_TABLET | Freq: Four times a day (QID) | ORAL | 0 refills | Status: AC | PRN
  Filled 2022-11-26: qty 180, 30d supply, fill #0

## 2022-11-26 MED ORDER — FAMOTIDINE 20 MG PO TABS
20.0000 mg | ORAL_TABLET | Freq: Two times a day (BID) | ORAL | 0 refills | Status: AC
  Filled 2022-11-26: qty 84, 42d supply, fill #0

## 2022-11-28 ENCOUNTER — Other Ambulatory Visit (HOSPITAL_COMMUNITY): Payer: Self-pay

## 2022-12-26 ENCOUNTER — Other Ambulatory Visit (HOSPITAL_COMMUNITY): Payer: Self-pay

## 2022-12-26 MED ORDER — OXYCODONE-ACETAMINOPHEN 10-325 MG PO TABS
ORAL_TABLET | Freq: Four times a day (QID) | ORAL | 0 refills | Status: AC
  Filled 2022-12-26: qty 180, 30d supply, fill #0

## 2022-12-26 MED ORDER — GABAPENTIN 300 MG PO CAPS
600.0000 mg | ORAL_CAPSULE | Freq: Three times a day (TID) | ORAL | 0 refills | Status: DC
  Filled 2022-12-26: qty 180, 30d supply, fill #0

## 2023-01-27 ENCOUNTER — Other Ambulatory Visit (HOSPITAL_COMMUNITY): Payer: Self-pay

## 2023-01-27 MED ORDER — VITAMIN D (ERGOCALCIFEROL) 1.25 MG (50000 UNIT) PO CAPS
50000.0000 [IU] | ORAL_CAPSULE | ORAL | 3 refills | Status: AC
  Filled 2023-01-27 – 2023-05-26 (×2): qty 12, 84d supply, fill #0

## 2023-01-27 MED ORDER — DOXYCYCLINE MONOHYDRATE 100 MG PO TABS
100.0000 mg | ORAL_TABLET | Freq: Two times a day (BID) | ORAL | 0 refills | Status: AC
  Filled 2023-01-27: qty 14, 7d supply, fill #0

## 2023-01-27 MED ORDER — PREDNISONE 10 MG PO TABS
10.0000 mg | ORAL_TABLET | Freq: Every day | ORAL | 0 refills | Status: AC
  Filled 2023-01-27: qty 5, 5d supply, fill #0

## 2023-01-27 MED ORDER — OXYCODONE-ACETAMINOPHEN 10-325 MG PO TABS
1.5000 | ORAL_TABLET | Freq: Four times a day (QID) | ORAL | 0 refills | Status: AC | PRN
  Filled 2023-01-27: qty 180, 30d supply, fill #0

## 2023-01-27 MED ORDER — GABAPENTIN 300 MG PO CAPS
600.0000 mg | ORAL_CAPSULE | Freq: Three times a day (TID) | ORAL | 0 refills | Status: DC
  Filled 2023-01-27: qty 180, 30d supply, fill #0

## 2023-02-25 ENCOUNTER — Other Ambulatory Visit (HOSPITAL_COMMUNITY): Payer: Self-pay

## 2023-02-25 MED ORDER — FAMOTIDINE 20 MG PO TABS
20.0000 mg | ORAL_TABLET | Freq: Two times a day (BID) | ORAL | 0 refills | Status: DC
  Filled 2023-02-25: qty 84, 42d supply, fill #0

## 2023-02-25 MED ORDER — OXYCODONE-ACETAMINOPHEN 10-325 MG PO TABS
1.5000 | ORAL_TABLET | Freq: Four times a day (QID) | ORAL | 0 refills | Status: DC | PRN
  Filled 2023-02-25: qty 180, 30d supply, fill #0

## 2023-02-25 MED ORDER — GABAPENTIN 300 MG PO CAPS
600.0000 mg | ORAL_CAPSULE | Freq: Three times a day (TID) | ORAL | 0 refills | Status: AC
  Filled 2023-02-25 – 2023-03-18 (×2): qty 180, 30d supply, fill #0

## 2023-03-07 ENCOUNTER — Other Ambulatory Visit (HOSPITAL_COMMUNITY): Payer: Self-pay

## 2023-03-18 ENCOUNTER — Other Ambulatory Visit (HOSPITAL_COMMUNITY): Payer: Self-pay

## 2023-03-28 ENCOUNTER — Other Ambulatory Visit (HOSPITAL_COMMUNITY): Payer: Self-pay

## 2023-03-28 MED ORDER — ERGOCALCIFEROL 1.25 MG (50000 UT) PO CAPS
50000.0000 [IU] | ORAL_CAPSULE | ORAL | 3 refills | Status: AC
  Filled 2023-03-28: qty 12, 84d supply, fill #0

## 2023-03-28 MED ORDER — GABAPENTIN 300 MG PO CAPS
600.0000 mg | ORAL_CAPSULE | Freq: Three times a day (TID) | ORAL | 0 refills | Status: AC
  Filled 2023-03-28: qty 180, 30d supply, fill #0

## 2023-03-28 MED ORDER — NALOXONE HCL 4 MG/0.1ML NA LIQD
NASAL | 1 refills | Status: AC
  Filled 2023-03-28 – 2024-02-14 (×2): qty 2, 1d supply, fill #0

## 2023-03-28 MED ORDER — OXYCODONE-ACETAMINOPHEN 10-325 MG PO TABS
1.5000 | ORAL_TABLET | Freq: Four times a day (QID) | ORAL | 0 refills | Status: DC | PRN
  Filled 2023-03-28: qty 180, 30d supply, fill #0

## 2023-03-31 ENCOUNTER — Other Ambulatory Visit (HOSPITAL_COMMUNITY): Payer: Self-pay

## 2023-04-24 ENCOUNTER — Other Ambulatory Visit (HOSPITAL_COMMUNITY): Payer: Self-pay

## 2023-04-28 ENCOUNTER — Other Ambulatory Visit (HOSPITAL_COMMUNITY): Payer: Self-pay

## 2023-04-28 MED ORDER — NALOXONE HCL 4 MG/0.1ML NA LIQD: NASAL | 1 refills | Status: AC

## 2023-04-28 MED ORDER — OXYCODONE-ACETAMINOPHEN 10-325 MG PO TABS
1.5000 | ORAL_TABLET | Freq: Four times a day (QID) | ORAL | 0 refills | Status: DC | PRN
  Filled 2023-04-28: qty 180, 30d supply, fill #0

## 2023-04-28 MED ORDER — GABAPENTIN 300 MG PO CAPS
600.0000 mg | ORAL_CAPSULE | Freq: Three times a day (TID) | ORAL | 0 refills | Status: AC
  Filled 2023-04-28: qty 180, 30d supply, fill #0

## 2023-04-29 ENCOUNTER — Other Ambulatory Visit (HOSPITAL_COMMUNITY): Payer: Self-pay

## 2023-05-23 ENCOUNTER — Other Ambulatory Visit (HOSPITAL_COMMUNITY): Payer: Self-pay

## 2023-05-26 ENCOUNTER — Other Ambulatory Visit: Payer: Self-pay

## 2023-05-26 ENCOUNTER — Other Ambulatory Visit (HOSPITAL_COMMUNITY): Payer: Self-pay

## 2023-05-26 MED ORDER — OXYCODONE-ACETAMINOPHEN 10-325 MG PO TABS
1.5000 | ORAL_TABLET | Freq: Four times a day (QID) | ORAL | 0 refills | Status: DC | PRN
  Filled 2023-05-26: qty 180, 30d supply, fill #0

## 2023-05-26 MED ORDER — GABAPENTIN 300 MG PO CAPS
600.0000 mg | ORAL_CAPSULE | Freq: Three times a day (TID) | ORAL | 0 refills | Status: AC
  Filled 2023-05-26: qty 180, 30d supply, fill #0

## 2023-05-26 MED ORDER — FAMOTIDINE 20 MG PO TABS
20.0000 mg | ORAL_TABLET | Freq: Two times a day (BID) | ORAL | 1 refills | Status: DC
  Filled 2023-05-26 (×3): qty 180, 90d supply, fill #0
  Filled 2023-07-24: qty 6, 3d supply, fill #0
  Filled 2023-07-24: qty 54, 27d supply, fill #0

## 2023-05-26 MED ORDER — NALOXONE HCL 4 MG/0.1ML NA LIQD
1.0000 | Freq: Once | NASAL | 0 refills | Status: AC
  Filled 2023-05-26: qty 2, 2d supply, fill #0

## 2023-05-27 ENCOUNTER — Other Ambulatory Visit (HOSPITAL_COMMUNITY): Payer: Self-pay

## 2023-05-27 ENCOUNTER — Other Ambulatory Visit: Payer: Self-pay

## 2023-05-30 ENCOUNTER — Other Ambulatory Visit: Payer: Self-pay

## 2023-05-30 ENCOUNTER — Other Ambulatory Visit (HOSPITAL_COMMUNITY): Payer: Self-pay

## 2023-05-30 MED ORDER — HYDROXYZINE HCL 25 MG PO TABS
25.0000 mg | ORAL_TABLET | Freq: Every day | ORAL | 0 refills | Status: AC
  Filled 2023-05-30: qty 30, 30d supply, fill #0

## 2023-05-30 MED ORDER — TRIAMCINOLONE ACETONIDE 0.1 % EX CREA
1.0000 | TOPICAL_CREAM | Freq: Two times a day (BID) | CUTANEOUS | 0 refills | Status: AC
  Filled 2023-05-30: qty 80, 30d supply, fill #0

## 2023-06-03 ENCOUNTER — Other Ambulatory Visit (HOSPITAL_COMMUNITY): Payer: Self-pay

## 2023-06-26 ENCOUNTER — Other Ambulatory Visit (HOSPITAL_COMMUNITY): Payer: Self-pay

## 2023-06-26 MED ORDER — OXYCODONE-ACETAMINOPHEN 10-325 MG PO TABS
1.5000 | ORAL_TABLET | Freq: Four times a day (QID) | ORAL | 0 refills | Status: AC | PRN
  Filled 2023-06-26: qty 180, 30d supply, fill #0

## 2023-06-26 MED ORDER — GABAPENTIN 300 MG PO CAPS
600.0000 mg | ORAL_CAPSULE | Freq: Three times a day (TID) | ORAL | 0 refills | Status: AC
Start: 2023-06-26 — End: ?
  Filled 2023-06-26: qty 180, 30d supply, fill #0

## 2023-06-26 MED ORDER — ERGOCALCIFEROL 1.25 MG (50000 UT) PO CAPS
ORAL_CAPSULE | ORAL | 1 refills | Status: AC
  Filled 2023-06-26: qty 12, 84d supply, fill #0
  Filled 2023-11-19: qty 12, 84d supply, fill #1

## 2023-07-23 ENCOUNTER — Other Ambulatory Visit (HOSPITAL_COMMUNITY): Payer: Self-pay

## 2023-07-23 MED ORDER — OXYCODONE-ACETAMINOPHEN 10-325 MG PO TABS
ORAL_TABLET | ORAL | 0 refills | Status: AC
  Filled 2023-07-23 – 2023-07-24 (×2): qty 180, 30d supply, fill #0

## 2023-07-23 MED ORDER — GABAPENTIN 300 MG PO CAPS
600.0000 mg | ORAL_CAPSULE | Freq: Three times a day (TID) | ORAL | 0 refills | Status: AC
  Filled 2023-07-23 – 2023-10-17 (×2): qty 180, 30d supply, fill #0

## 2023-07-23 MED ORDER — D3-50 1.25 MG (50000 UT) PO CAPS
50000.0000 [IU] | ORAL_CAPSULE | ORAL | 1 refills | Status: AC
  Filled 2023-07-23: qty 12, 84d supply, fill #0

## 2023-07-24 ENCOUNTER — Other Ambulatory Visit (HOSPITAL_COMMUNITY): Payer: Self-pay

## 2023-07-24 ENCOUNTER — Other Ambulatory Visit: Payer: Self-pay

## 2023-08-04 ENCOUNTER — Other Ambulatory Visit (HOSPITAL_COMMUNITY): Payer: Self-pay

## 2023-08-04 MED ORDER — WEGOVY 0.25 MG/0.5ML ~~LOC~~ SOAJ
0.2500 mg | SUBCUTANEOUS | 0 refills | Status: AC
  Filled 2023-08-04: qty 2, 28d supply, fill #0

## 2023-08-05 ENCOUNTER — Telehealth: Payer: Self-pay | Admitting: Orthopedic Surgery

## 2023-08-05 NOTE — Telephone Encounter (Signed)
 Patient called and ask if you could fax the last note when she is being seen to her employment because they misplaced it. [phone]

## 2023-08-06 NOTE — Telephone Encounter (Signed)
 Can you please call this pt. She has not been in the office since 2023. Will need visit if she is needing restrictions?

## 2023-08-25 ENCOUNTER — Other Ambulatory Visit: Payer: Self-pay

## 2023-08-25 ENCOUNTER — Other Ambulatory Visit (HOSPITAL_COMMUNITY): Payer: Self-pay

## 2023-08-25 MED ORDER — OXYCODONE-ACETAMINOPHEN 10-325 MG PO TABS
1.5000 | ORAL_TABLET | Freq: Four times a day (QID) | ORAL | 0 refills | Status: AC | PRN
  Filled 2023-08-25: qty 180, 30d supply, fill #0

## 2023-08-25 MED ORDER — FAMOTIDINE 20 MG PO TABS
20.0000 mg | ORAL_TABLET | Freq: Two times a day (BID) | ORAL | 1 refills | Status: DC
  Filled 2023-08-25: qty 180, 90d supply, fill #0

## 2023-08-25 MED ORDER — GABAPENTIN 300 MG PO CAPS
600.0000 mg | ORAL_CAPSULE | Freq: Three times a day (TID) | ORAL | 0 refills | Status: AC
  Filled 2023-08-25: qty 180, 30d supply, fill #0

## 2023-09-01 ENCOUNTER — Ambulatory Visit (INDEPENDENT_AMBULATORY_CARE_PROVIDER_SITE_OTHER): Payer: Self-pay | Admitting: Orthopedic Surgery

## 2023-09-01 ENCOUNTER — Other Ambulatory Visit: Payer: Self-pay

## 2023-09-01 DIAGNOSIS — M25571 Pain in right ankle and joints of right foot: Secondary | ICD-10-CM | POA: Diagnosis not present

## 2023-09-06 ENCOUNTER — Other Ambulatory Visit (HOSPITAL_COMMUNITY): Payer: Self-pay

## 2023-09-09 ENCOUNTER — Encounter: Payer: Self-pay | Admitting: Orthopedic Surgery

## 2023-09-09 DIAGNOSIS — M25571 Pain in right ankle and joints of right foot: Secondary | ICD-10-CM | POA: Diagnosis not present

## 2023-09-09 MED ORDER — LIDOCAINE HCL 1 % IJ SOLN
2.0000 mL | INTRAMUSCULAR | Status: AC | PRN
  Administered 2023-09-09: 2 mL

## 2023-09-09 MED ORDER — METHYLPREDNISOLONE ACETATE 40 MG/ML IJ SUSP
40.0000 mg | INTRAMUSCULAR | Status: AC | PRN
  Administered 2023-09-09: 40 mg via INTRA_ARTICULAR

## 2023-09-09 NOTE — Progress Notes (Signed)
 Office Visit Note   Patient: Karen Cuevas           Date of Birth: Jan 02, 1966           MRN: [id number] Visit Date: 09/01/2023              Requested by: No referring provider defined for this encounter. PCP: Pcp, No  Chief Complaint  Patient presents with   Right Ankle - Pain      HPI: Patient is a 58 year old woman is seen in follow-up for right ankle and subtalar pain.  Status post an injection in 2023.  Previous injection was in the subtalar joint.  Patient states she now is having pain over the anterior medial joint line.  Assessment & Plan: Visit Diagnoses:  1. Pain in right ankle and joints of right foot     Plan: Right ankle was injected.  Follow-up as needed.  Follow-Up Instructions: Return if symptoms worsen or fail to improve.   Ortho Exam  Patient is alert, oriented, no adenopathy, well-dressed, normal affect, normal respiratory effort. Examination patient does not have pain to palpation over the sinus Tarsi she has decreased subtalar motion and good ankle range of motion.  She has tenderness to palpation of the anterior medial joint line of the ankle.  She has tenderness to palpation of the posterior tibial tendon as well as the peroneal tendons.    Imaging: No results found. No images are attached to the encounter.  Labs: No results found for: HGBA1C, ESRSEDRATE, CRP, LABURIC, REPTSTATUS, GRAMSTAIN, CULT, LABORGA   No results found for: ALBUMIN, PREALBUMIN, CBC  No results found for: MG No results found for: VD25OH  No results found for: PREALBUMIN    11/24/2007    3:20 PM  CBC EXTENDED  WBC 2.8   RBC 4.03   Hemoglobin 13.0   HCT 37.5   Platelets 215      There is no height or weight on file to calculate BMI.  Orders:  No orders of the defined types were placed in this encounter.  No orders of the defined types were placed in this encounter.    Procedures: Medium Joint Inj: R ankle on 09/09/2023 9:58  AM Indications: pain and diagnostic evaluation Details: 22 G 1.5 in needle, anteromedial approach Medications: 2 mL lidocaine  1 %; 40 mg methylPREDNISolone  acetate 40 MG/ML Outcome: tolerated well, no immediate complications Procedure, treatment alternatives, risks and benefits explained, specific risks discussed. Consent was given by the patient. Immediately prior to procedure a time out was called to verify the correct patient, procedure, equipment, support staff and site/side marked as required. Patient was prepped and draped in the usual sterile fashion.      Clinical Data: No additional findings.  ROS:  All other systems negative, except as noted in the HPI. Review of Systems  Objective: Vital Signs: LMP 11/23/2014   Specialty Comments:  No specialty comments available.  PMFS History: There are no active problems to display for this patient.  Past Medical History:  Diagnosis Date   Arthritis    Carpal tunnel syndrome     Family History  Problem Relation Age of Onset   Hypertension Other    Diabetes Other     History reviewed. No pertinent surgical history. Social History   Occupational History   Not on file  Tobacco Use   Smoking status: Never   Smokeless tobacco: Never  Vaping Use   Vaping status: Never Used  Substance and Sexual Activity  Alcohol use: No   Drug use: No   Sexual activity: Not on file

## 2023-09-23 ENCOUNTER — Other Ambulatory Visit (HOSPITAL_COMMUNITY): Payer: Self-pay

## 2023-09-23 ENCOUNTER — Other Ambulatory Visit: Payer: Self-pay

## 2023-09-23 MED ORDER — D3-50 1.25 MG (50000 UT) PO CAPS
50000.0000 [IU] | ORAL_CAPSULE | ORAL | 1 refills | Status: AC
  Filled 2023-09-23: qty 12, 84d supply, fill #0

## 2023-09-23 MED ORDER — FAMOTIDINE 40 MG PO TABS
40.0000 mg | ORAL_TABLET | Freq: Every day | ORAL | 1 refills | Status: AC
  Filled 2023-09-23: qty 90, 90d supply, fill #0

## 2023-09-23 MED ORDER — GABAPENTIN 300 MG PO CAPS
600.0000 mg | ORAL_CAPSULE | Freq: Three times a day (TID) | ORAL | 0 refills | Status: AC
  Filled 2023-09-23 – 2023-10-17 (×3): qty 180, 30d supply, fill #0

## 2023-09-23 MED ORDER — OXYCODONE-ACETAMINOPHEN 10-325 MG PO TABS
ORAL_TABLET | ORAL | 0 refills | Status: DC
  Filled 2023-09-23: qty 180, 30d supply, fill #0

## 2023-10-06 ENCOUNTER — Other Ambulatory Visit (HOSPITAL_COMMUNITY): Payer: Self-pay

## 2023-10-17 ENCOUNTER — Other Ambulatory Visit (HOSPITAL_COMMUNITY): Payer: Self-pay

## 2023-10-20 ENCOUNTER — Other Ambulatory Visit (HOSPITAL_COMMUNITY): Payer: Self-pay

## 2023-10-20 ENCOUNTER — Other Ambulatory Visit: Payer: Self-pay

## 2023-10-20 MED ORDER — TERBINAFINE HCL 1 % EX CREA
TOPICAL_CREAM | CUTANEOUS | 0 refills | Status: AC
  Filled 2023-10-20: qty 30, 14d supply, fill #0

## 2023-10-20 MED ORDER — D3-50 1.25 MG (50000 UT) PO CAPS
50000.0000 [IU] | ORAL_CAPSULE | ORAL | 1 refills | Status: AC
  Filled 2023-10-20: qty 12, 84d supply, fill #0

## 2023-10-20 MED ORDER — DIETHYLPROPION HCL ER 75 MG PO TB24
75.0000 mg | ORAL_TABLET | Freq: Every morning | ORAL | 0 refills | Status: AC
  Filled 2023-10-20: qty 30, 30d supply, fill #0

## 2023-10-24 ENCOUNTER — Other Ambulatory Visit (HOSPITAL_COMMUNITY): Payer: Self-pay

## 2023-10-24 MED ORDER — OXYCODONE-ACETAMINOPHEN 10-325 MG PO TABS
1.5000 | ORAL_TABLET | Freq: Four times a day (QID) | ORAL | 0 refills | Status: DC | PRN
Start: 2023-10-24 — End: 2023-11-24
  Filled 2023-10-24: qty 180, 30d supply, fill #0

## 2023-10-24 MED ORDER — GABAPENTIN 300 MG PO CAPS
600.0000 mg | ORAL_CAPSULE | Freq: Three times a day (TID) | ORAL | 0 refills | Status: AC
  Filled 2023-10-24 – 2023-11-14 (×2): qty 180, 30d supply, fill #0

## 2023-10-27 IMAGING — CT CT HEAD W/O CM
3 series · 14 of 47 positions shown, 16 images · non-contrast
Comparison: None Available.

CLINICAL DATA: Fell and hit left side of head yesterday



[Series 3: head 5.0 h30s · axial · 0.51mm/px · z∈[-104,+41]mm · 8 of 35 slices shown, 10 images]
[im 3/35  brain]
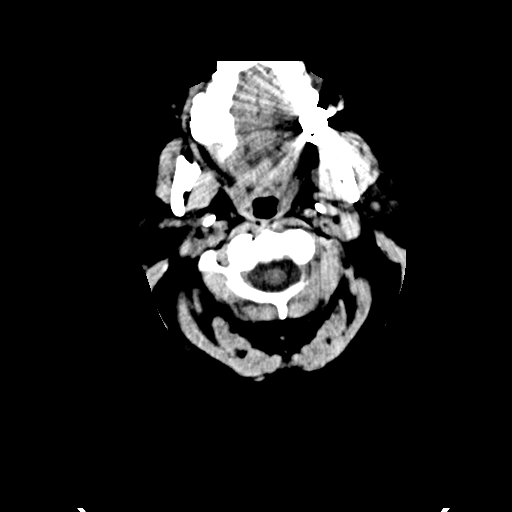
[im 3/35  bone]
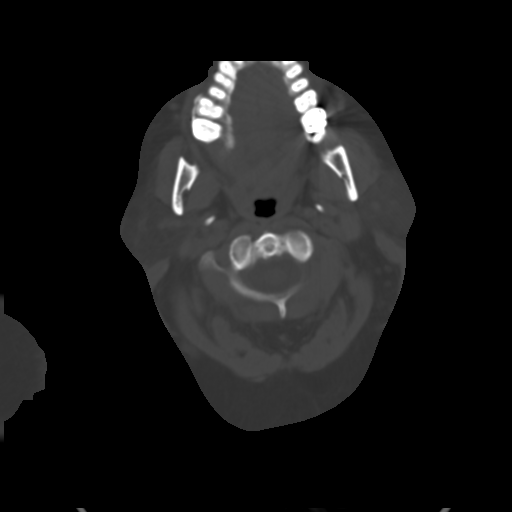
[im 8/35  brain]
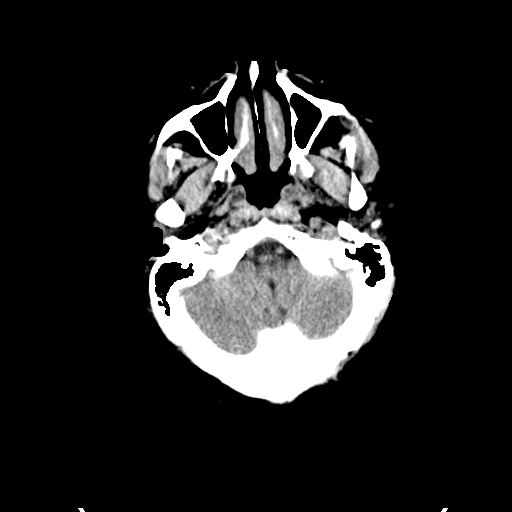
[im 11/35  brain]
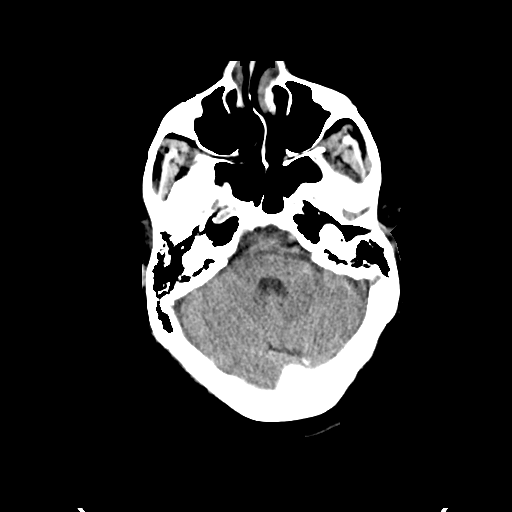
[im 16/35  brain]
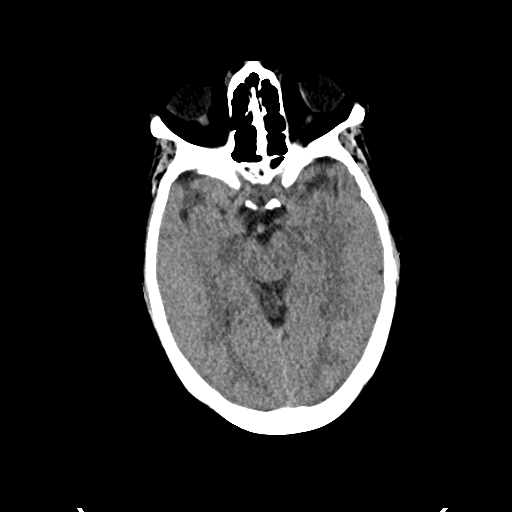
[im 19/35  brain]
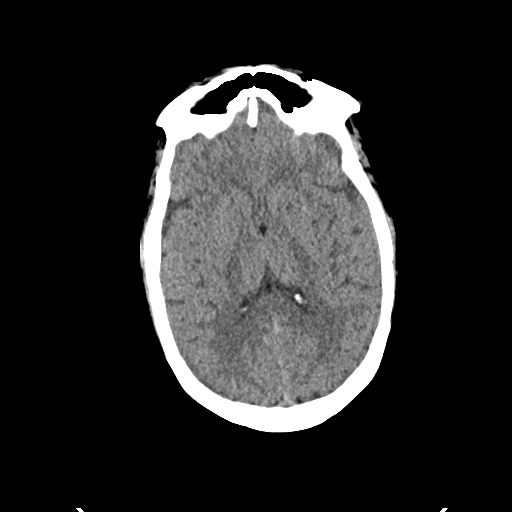
[im 19/35  bone]
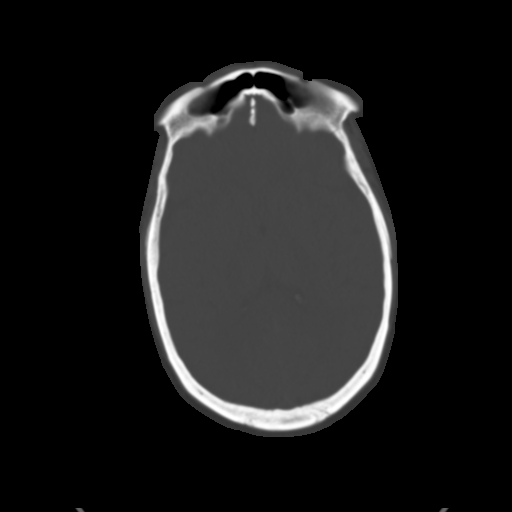
[im 24/35  brain]
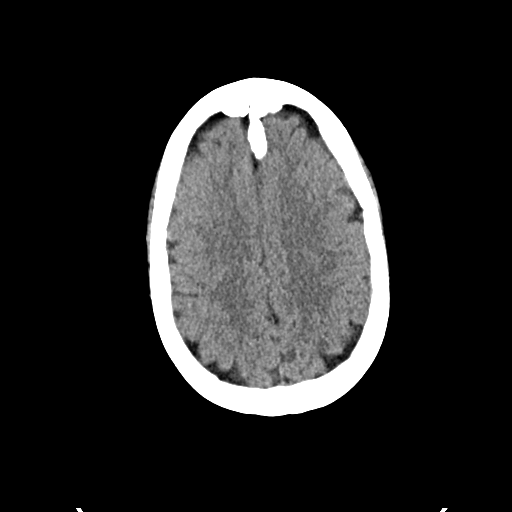
[im 27/35  brain]
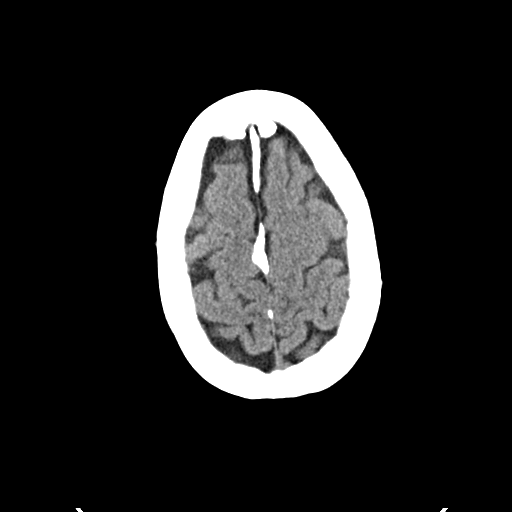
[im 32/35  brain]
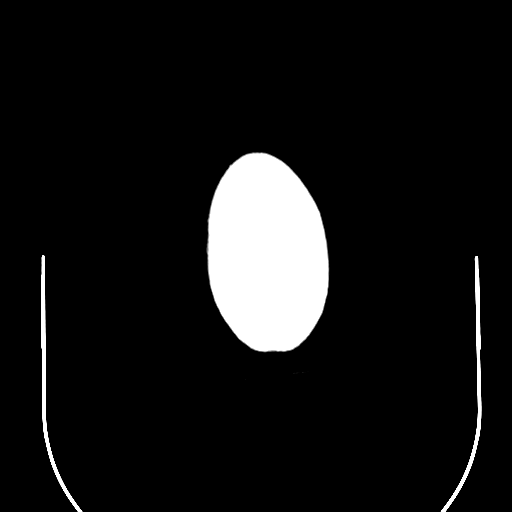

[Series 4: head 3.0 mpr cor · coronal · 0.33mm/px · 3 of 67 slices shown]
[im 23/67  brain]
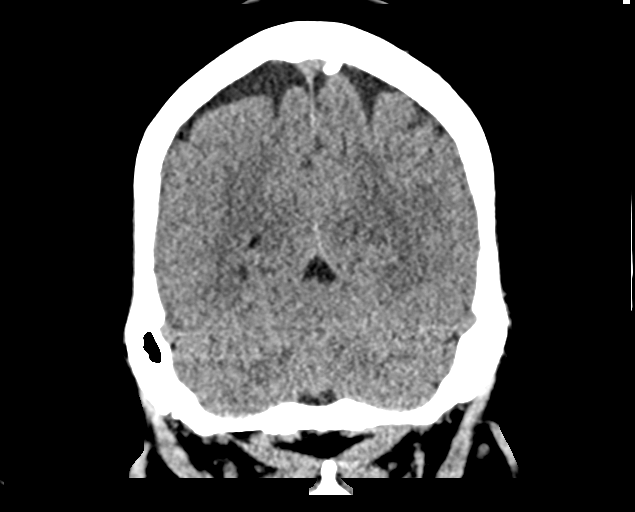
[im 30/67  brain]
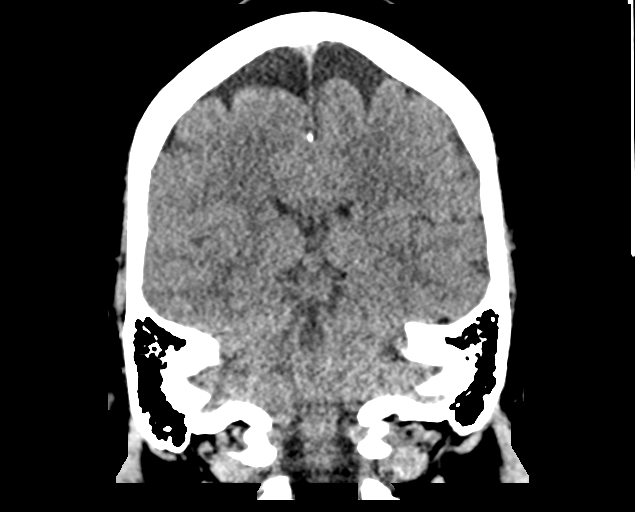
[im 37/67  brain]
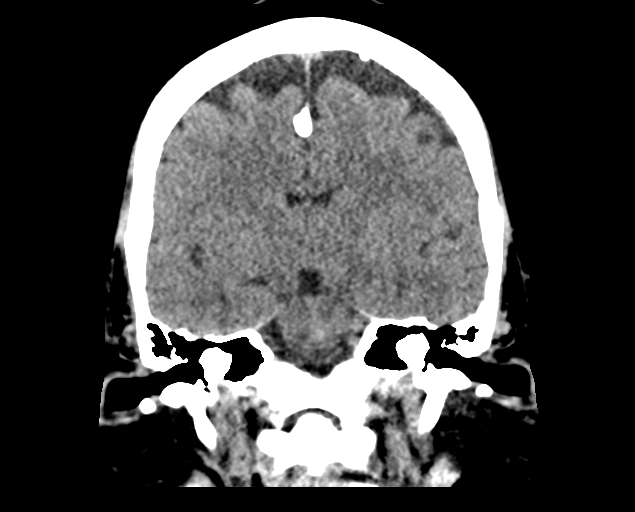

[Series 5: head 3.0 mpr sag · sagittal · 0.33mm/px · 3 of 67 slices shown]
[im 23/67  brain]
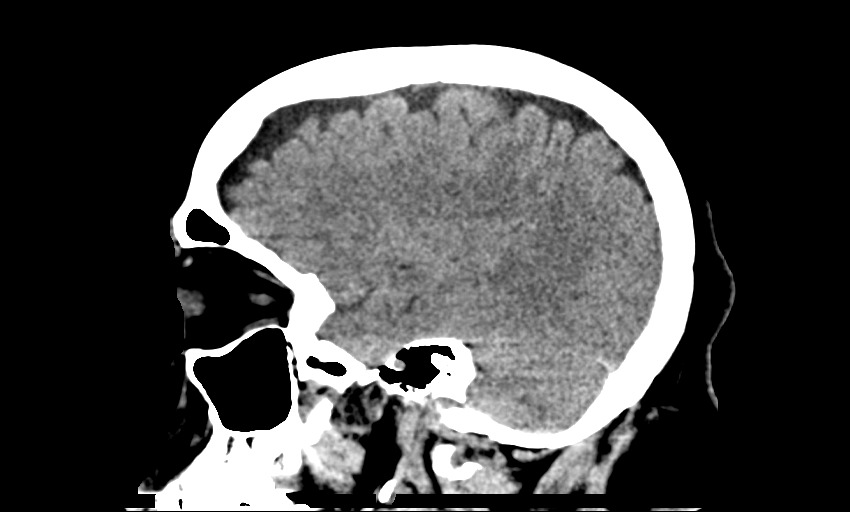
[im 34/67  brain]
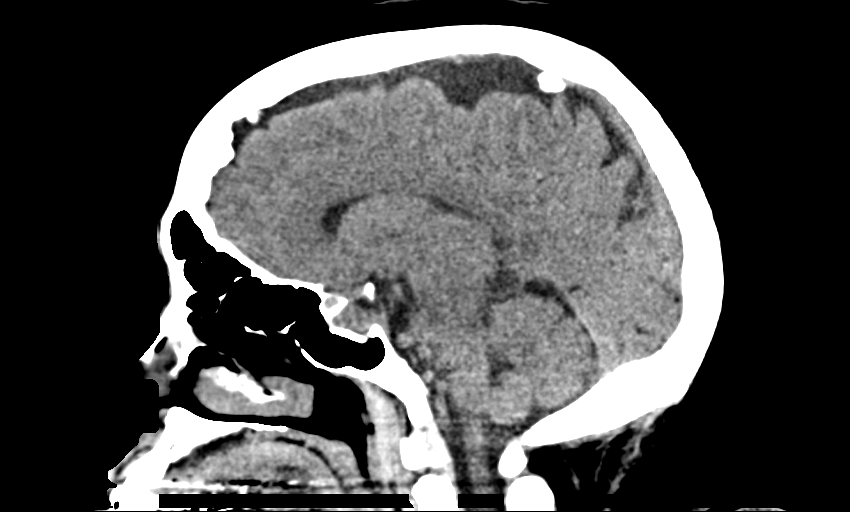
[im 45/67  brain]
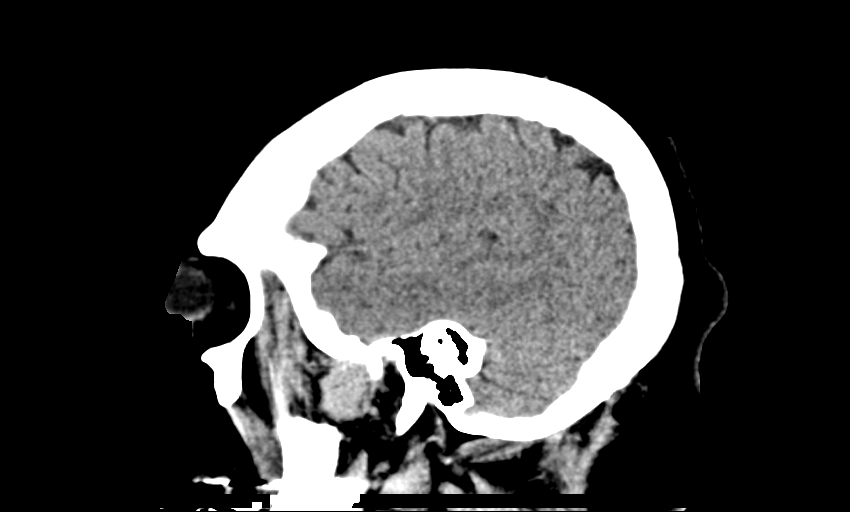

[14 of 47 positions shown; findings below may reference images not displayed]

FINDINGS: Brain: No acute infarct or hemorrhage. Lateral ventricles and
midline structures are unremarkable. No acute extra-axial fluid
collections. No mass effect.

Vascular: No hyperdense vessel or unexpected calcification.

Skull: Normal. Negative for fracture or focal lesion.

Sinuses/Orbits: No acute finding.

Other: None.
IMPRESSION: 1. No acute intracranial process.

## 2023-10-30 ENCOUNTER — Other Ambulatory Visit (HOSPITAL_COMMUNITY): Payer: Self-pay

## 2023-11-03 ENCOUNTER — Other Ambulatory Visit (HOSPITAL_BASED_OUTPATIENT_CLINIC_OR_DEPARTMENT_OTHER): Payer: Self-pay

## 2023-11-05 ENCOUNTER — Other Ambulatory Visit (HOSPITAL_COMMUNITY): Payer: Self-pay

## 2023-11-05 MED ORDER — BUSPIRONE HCL 10 MG PO TABS
10.0000 mg | ORAL_TABLET | Freq: Three times a day (TID) | ORAL | 2 refills | Status: AC
  Filled 2023-11-05: qty 90, 30d supply, fill #0

## 2023-11-10 ENCOUNTER — Other Ambulatory Visit (HOSPITAL_COMMUNITY): Payer: Self-pay

## 2023-11-12 ENCOUNTER — Other Ambulatory Visit (HOSPITAL_COMMUNITY): Payer: Self-pay

## 2023-11-13 ENCOUNTER — Other Ambulatory Visit (HOSPITAL_COMMUNITY): Payer: Self-pay

## 2023-11-14 ENCOUNTER — Other Ambulatory Visit: Payer: Self-pay

## 2023-11-14 ENCOUNTER — Other Ambulatory Visit (HOSPITAL_COMMUNITY): Payer: Self-pay

## 2023-11-19 ENCOUNTER — Other Ambulatory Visit (HOSPITAL_BASED_OUTPATIENT_CLINIC_OR_DEPARTMENT_OTHER): Payer: Self-pay

## 2023-11-21 ENCOUNTER — Other Ambulatory Visit (HOSPITAL_COMMUNITY): Payer: Self-pay

## 2023-11-24 ENCOUNTER — Other Ambulatory Visit (HOSPITAL_COMMUNITY): Payer: Self-pay

## 2023-11-24 MED ORDER — GABAPENTIN 300 MG PO CAPS
600.0000 mg | ORAL_CAPSULE | Freq: Three times a day (TID) | ORAL | 0 refills | Status: AC
  Filled 2023-11-24: qty 180, 30d supply, fill #0

## 2023-11-24 MED ORDER — D3-50 1.25 MG (50000 UT) PO CAPS
50000.0000 [IU] | ORAL_CAPSULE | ORAL | 1 refills | Status: AC
  Filled 2023-11-24: qty 12, 84d supply, fill #0

## 2023-11-24 MED ORDER — OXYCODONE-ACETAMINOPHEN 10-325 MG PO TABS
1.5000 | ORAL_TABLET | Freq: Four times a day (QID) | ORAL | 0 refills | Status: AC | PRN
  Filled 2023-11-24: qty 180, 30d supply, fill #0

## 2023-11-25 ENCOUNTER — Other Ambulatory Visit (HOSPITAL_COMMUNITY): Payer: Self-pay

## 2023-12-04 ENCOUNTER — Other Ambulatory Visit (HOSPITAL_COMMUNITY): Payer: Self-pay

## 2023-12-24 ENCOUNTER — Other Ambulatory Visit (HOSPITAL_COMMUNITY): Payer: Self-pay

## 2023-12-24 MED ORDER — FAMOTIDINE 40 MG PO TABS: 40.0000 mg | ORAL_TABLET | Freq: Every day | ORAL | 1 refills | Status: AC

## 2023-12-24 MED ORDER — D3-50 1.25 MG (50000 UT) PO CAPS: 50000.0000 [IU] | ORAL_CAPSULE | ORAL | 1 refills | Status: AC

## 2023-12-24 MED ORDER — GABAPENTIN 300 MG PO CAPS
600.0000 mg | ORAL_CAPSULE | Freq: Three times a day (TID) | ORAL | 0 refills | Status: AC
  Filled 2023-12-24 – 2024-02-16 (×3): qty 180, 30d supply, fill #0

## 2023-12-24 MED ORDER — OXYCODONE-ACETAMINOPHEN 10-325 MG PO TABS
ORAL_TABLET | Freq: Four times a day (QID) | ORAL | 0 refills | Status: AC
  Filled 2023-12-24: qty 180, 30d supply, fill #0

## 2024-01-03 ENCOUNTER — Other Ambulatory Visit (HOSPITAL_COMMUNITY): Payer: Self-pay

## 2024-01-19 ENCOUNTER — Encounter: Payer: Self-pay | Admitting: Radiology

## 2024-02-04 ENCOUNTER — Other Ambulatory Visit (HOSPITAL_COMMUNITY): Payer: Self-pay

## 2024-02-05 ENCOUNTER — Other Ambulatory Visit (HOSPITAL_COMMUNITY): Payer: Self-pay

## 2024-02-13 ENCOUNTER — Other Ambulatory Visit (HOSPITAL_COMMUNITY): Payer: Self-pay

## 2024-02-14 ENCOUNTER — Other Ambulatory Visit (HOSPITAL_COMMUNITY): Payer: Self-pay

## 2024-02-16 ENCOUNTER — Other Ambulatory Visit (HOSPITAL_COMMUNITY): Payer: Self-pay

## 2024-02-23 ENCOUNTER — Other Ambulatory Visit (HOSPITAL_COMMUNITY): Payer: Self-pay

## 2024-03-23 ENCOUNTER — Other Ambulatory Visit: Payer: Self-pay | Admitting: Physician Assistant

## 2024-03-23 DIAGNOSIS — M5412 Radiculopathy, cervical region: Secondary | ICD-10-CM

## 2024-03-23 DIAGNOSIS — M5416 Radiculopathy, lumbar region: Secondary | ICD-10-CM
